# Patient Record
Sex: Male | Born: 1965 | Race: Black or African American | Hispanic: No | Marital: Single | Smoking: Current every day smoker
Health system: Southern US, Community
[De-identification: ages and names within clinical notes are randomized; demographics above are authoritative.]

## PROBLEM LIST (undated history)

## (undated) ENCOUNTER — Emergency Department (HOSPITAL_COMMUNITY): Admission: EM | Payer: Self-pay | Source: Home / Self Care

## (undated) ENCOUNTER — Ambulatory Visit (HOSPITAL_COMMUNITY): Discharge status: Absent without leave | Payer: Self-pay

## (undated) DIAGNOSIS — E119 Type 2 diabetes mellitus without complications: Secondary | ICD-10-CM

## (undated) DIAGNOSIS — K279 Peptic ulcer, site unspecified, unspecified as acute or chronic, without hemorrhage or perforation: Secondary | ICD-10-CM

## (undated) DIAGNOSIS — I10 Essential (primary) hypertension: Secondary | ICD-10-CM

---

## 1997-09-10 ENCOUNTER — Emergency Department (HOSPITAL_COMMUNITY): Admission: EM | Admit: 1997-09-10 | Discharge: 1997-09-10 | Payer: Self-pay | Admitting: Emergency Medicine

## 1998-04-19 ENCOUNTER — Emergency Department (HOSPITAL_COMMUNITY): Admission: EM | Admit: 1998-04-19 | Discharge: 1998-04-19 | Payer: Self-pay | Admitting: Emergency Medicine

## 1998-12-21 ENCOUNTER — Emergency Department (HOSPITAL_COMMUNITY): Admission: EM | Admit: 1998-12-21 | Discharge: 1998-12-21 | Payer: Self-pay | Admitting: Emergency Medicine

## 1999-06-12 ENCOUNTER — Emergency Department (HOSPITAL_COMMUNITY): Admission: EM | Admit: 1999-06-12 | Discharge: 1999-06-12 | Payer: Self-pay | Admitting: Emergency Medicine

## 1999-06-21 ENCOUNTER — Emergency Department (HOSPITAL_COMMUNITY): Admission: EM | Admit: 1999-06-21 | Discharge: 1999-06-21 | Payer: Self-pay | Admitting: Emergency Medicine

## 1999-11-22 ENCOUNTER — Encounter: Payer: Self-pay | Admitting: Emergency Medicine

## 1999-11-22 ENCOUNTER — Emergency Department (HOSPITAL_COMMUNITY): Admission: EM | Admit: 1999-11-22 | Discharge: 1999-11-22 | Payer: Self-pay | Admitting: Emergency Medicine

## 1999-12-19 ENCOUNTER — Encounter: Payer: Self-pay | Admitting: Emergency Medicine

## 1999-12-19 ENCOUNTER — Emergency Department (HOSPITAL_COMMUNITY): Admission: EM | Admit: 1999-12-19 | Discharge: 1999-12-19 | Payer: Self-pay | Admitting: *Deleted

## 2003-12-09 ENCOUNTER — Emergency Department (HOSPITAL_COMMUNITY): Admission: EM | Admit: 2003-12-09 | Discharge: 2003-12-09 | Payer: Self-pay | Admitting: Emergency Medicine

## 2005-05-30 ENCOUNTER — Emergency Department (HOSPITAL_COMMUNITY): Admission: EM | Admit: 2005-05-30 | Discharge: 2005-05-30 | Payer: Self-pay | Admitting: Emergency Medicine

## 2006-11-13 ENCOUNTER — Other Ambulatory Visit: Payer: Self-pay

## 2006-11-13 ENCOUNTER — Emergency Department: Payer: Self-pay | Admitting: Emergency Medicine

## 2008-12-30 ENCOUNTER — Encounter: Admission: RE | Admit: 2008-12-30 | Discharge: 2008-12-30 | Payer: Self-pay | Admitting: Occupational Medicine

## 2009-02-12 ENCOUNTER — Emergency Department (HOSPITAL_COMMUNITY): Admission: EM | Admit: 2009-02-12 | Discharge: 2009-02-12 | Payer: Self-pay | Admitting: Emergency Medicine

## 2013-07-05 ENCOUNTER — Encounter (HOSPITAL_COMMUNITY): Payer: Self-pay | Admitting: Emergency Medicine

## 2013-07-05 ENCOUNTER — Emergency Department (INDEPENDENT_AMBULATORY_CARE_PROVIDER_SITE_OTHER)
Admission: EM | Admit: 2013-07-05 | Discharge: 2013-07-05 | Disposition: A | Discharge status: Home / Self Care | Payer: No Typology Code available for payment source | Source: Home / Self Care | Attending: Emergency Medicine | Admitting: Emergency Medicine

## 2013-07-05 DIAGNOSIS — M5431 Sciatica, right side: Secondary | ICD-10-CM

## 2013-07-05 DIAGNOSIS — M5430 Sciatica, unspecified side: Secondary | ICD-10-CM

## 2013-07-05 HISTORY — DX: Type 2 diabetes mellitus without complications: E11.9

## 2013-07-05 MED ORDER — CYCLOBENZAPRINE HCL 10 MG PO TABS: 10.0000 mg | ORAL_TABLET | Freq: Three times a day (TID) | ORAL | Status: DC | PRN

## 2013-07-05 MED ORDER — PREDNISONE 20 MG PO TABS
ORAL_TABLET | ORAL | Status: AC
  Filled 2013-07-05: qty 3

## 2013-07-05 MED ORDER — HYDROCODONE-ACETAMINOPHEN 5-325 MG PO TABS
ORAL_TABLET | ORAL | Status: AC
  Filled 2013-07-05: qty 1

## 2013-07-05 MED ORDER — PREDNISONE 20 MG PO TABS: 40.0000 mg | ORAL_TABLET | Freq: Every day | ORAL | Status: DC

## 2013-07-05 MED ORDER — PREDNISONE 20 MG PO TABS
60.0000 mg | ORAL_TABLET | Freq: Once | ORAL | Status: AC
  Administered 2013-07-05: 60 mg via ORAL

## 2013-07-05 MED ORDER — HYDROCODONE-ACETAMINOPHEN 5-325 MG PO TABS: 1.0000 | ORAL_TABLET | ORAL | Status: DC | PRN

## 2013-07-05 MED ORDER — HYDROCODONE-ACETAMINOPHEN 5-325 MG PO TABS
1.0000 | ORAL_TABLET | Freq: Once | ORAL | Status: AC
  Administered 2013-07-05: 1 via ORAL

## 2013-07-05 NOTE — ED Provider Notes (Signed)
CSN: [id number]     Arrival date & time 07/05/13  1042 History   First MD Initiated Contact with Patient 07/05/13 1132     Chief Complaint  Patient presents with  . Back Pain   (Consider location/radiation/quality/duration/timing/severity/associated sxs/prior Treatment) Patient is a 48 y.o. male presenting with back pain. The history is provided by the patient.  Back Pain Location:  Lumbar spine Quality:  Burning Radiates to:  R thigh Pain severity:  Severe Onset quality:  Sudden Duration:  3 days Timing:  Constant Progression:  Worsening Chronicity:  New Context: lifting heavy objects   Relieved by:  Nothing Worsened by:  Ambulation, standing, sitting and movement Associated symptoms: leg pain   Associated symptoms: no bladder incontinence, no bowel incontinence, no dysuria, no fever, no paresthesias, no perianal numbness, no tingling and no weakness   Pt reports increasing (R) lower back pain since heavy lifting at work on Thursday. The pain radiates into his (R) anterior thigh and is worse with most movement especially bending, twisting and sitting. Has received no relief w/ OTC "muscle pain rubs". Denies weakness of RLE or other focal neurological symptoms.   Past Medical History  Diagnosis Date  . Diabetes mellitus without complication    History reviewed. No pertinent past surgical history. No family history on file. History  Substance Use Topics  . Smoking status: Never Smoker   . Smokeless tobacco: Not on file  . Alcohol Use: No    Review of Systems  Constitutional: Negative for fever.  HENT: Negative.   Eyes: Negative.   Respiratory: Negative.   Cardiovascular: Negative.   Gastrointestinal: Negative.  Negative for bowel incontinence.  Endocrine: Negative.   Genitourinary: Negative.  Negative for bladder incontinence and dysuria.  Musculoskeletal: Positive for back pain.  Skin: Negative.   Allergic/Immunologic: Negative.   Neurological: Negative.  Negative  for tingling, weakness and paresthesias.  Hematological: Negative.   Psychiatric/Behavioral: Negative.     Allergies  Review of patient's allergies indicates no known allergies.  Home Medications   Current Outpatient Rx  Name  Route  Sig  Dispense  Refill  . insulin NPH-regular Human (NOVOLIN 70/30) (70-30) 100 UNIT/ML injection   Subcutaneous   Inject into the skin.          BP 135/81  Pulse 75  Temp(Src) 98.6 F (37 C) (Oral)  Resp 14  SpO2 100% Physical Exam  Constitutional: He is oriented to person, place, and time. He appears well-developed and well-nourished.  HENT:  Head: Normocephalic and atraumatic.  Eyes: Conjunctivae are normal.  Cardiovascular: Normal rate.   Pulmonary/Chest: Effort normal.  Musculoskeletal:       Back:  TTP over (R) lumbar paraspinal region, no bony TTP over L-spine.   Neurological: He is alert and oriented to person, place, and time.  Skin: Skin is warm and dry.  Psychiatric: He has a normal mood and affect.    ED Course  Procedures (including critical care time) Labs Review Labs Reviewed - No data to display Imaging Review No results found.    MDM  No diagnosis found. (R) Sciatica s/p heavy lifting on Thursday w/ radiation of pain into RLE. Will treat w/ Prednisone, Flexeril and short course of medication for pain. Will provise ortho referral for f/u if pain persist after tx. Pt agreeable w/ plan.    Leanne ChangKatherine P Roseanne Juenger, NP 07/05/13 1241

## 2013-07-05 NOTE — ED Notes (Signed)
Pt  Reports   Pain  In  Back  Radiating  Down  r      Leg   With  Some  Numbness  As  Well    Pt  States  He    Did  Some  Lifting  2  Days  Ago        He  denys  Any  Urinary  Symptoms

## 2013-07-05 NOTE — Discharge Instructions (Signed)
Take medications as directed, rest for the next 2 days with no heavy lifting or strenuous activity. You may return to work Tuesday if you can be provided with "light duties'. No heavy lifting over 15-20 lbs. If your pain persist after treatment we have provided a referral to an orthopedic doctor with whom you can arrange follow up.   Sciatica Sciatica is pain, weakness, numbness, or tingling along your sciatic nerve. The nerve starts in the lower back and runs down the back of each leg. Nerve damage or certain conditions pinch or put pressure on the sciatic nerve. This causes the pain, weakness, and other discomforts of sciatica. HOME CARE   Only take medicine as told by your doctor.  Apply ice to the affected area for 20 minutes. Do this 3 4 times a day for the first 48 72 hours. Then try heat in the same way.  Exercise, stretch, or do your usual activities if these do not make your pain worse.  Go to physical therapy as told by your doctor.  Keep all doctor visits as told.  Do not wear high heels or shoes that are not supportive.  Get a firm mattress if your mattress is too soft to lessen pain and discomfort. GET HELP RIGHT AWAY IF:   You cannot control when you poop (bowel movement) or pee (urinate).  You have more weakness in your lower back, lower belly (pelvis), butt (buttocks), or legs.  You have redness or puffiness (swelling) of your back.  You have a burning feeling when you pee.  You have pain that gets worse when you lie down.  You have pain that wakes you from your sleep.  Your pain is worse than past pain.  Your pain lasts longer than 4 weeks.  You are suddenly losing weight without reason. MAKE SURE YOU:   Understand these instructions.  Will watch this condition.  Will get help right away if you are not doing well or get worse. Document Released: 02/29/2008 Document Revised: 11/21/2011 Document Reviewed: 10/01/2011 [facility] Patient Information 2014  [place], [place].

## 2013-07-06 NOTE — ED Provider Notes (Signed)
Medical screening examination/treatment/procedure(s) were performed by non-physician practitioner and as supervising physician I was immediately available for consultation/collaboration.  Leslee Homeavid Jodie Leiner, M.D.   Reuben Likesavid C Christinna Sprung, MD 07/06/13 [phone]

## 2013-07-16 ENCOUNTER — Encounter (HOSPITAL_COMMUNITY): Payer: Self-pay | Admitting: Emergency Medicine

## 2013-07-16 ENCOUNTER — Emergency Department (INDEPENDENT_AMBULATORY_CARE_PROVIDER_SITE_OTHER)
Admission: EM | Admit: 2013-07-16 | Discharge: 2013-07-16 | Disposition: A | Discharge status: Home / Self Care | Payer: No Typology Code available for payment source | Source: Home / Self Care | Attending: Family Medicine | Admitting: Family Medicine

## 2013-07-16 ENCOUNTER — Emergency Department (INDEPENDENT_AMBULATORY_CARE_PROVIDER_SITE_OTHER): Payer: No Typology Code available for payment source

## 2013-07-16 DIAGNOSIS — M545 Low back pain, unspecified: Secondary | ICD-10-CM

## 2013-07-16 MED ORDER — DICLOFENAC SODIUM 75 MG PO TBEC: 75.0000 mg | DELAYED_RELEASE_TABLET | Freq: Two times a day (BID) | ORAL | Status: DC

## 2013-07-16 NOTE — ED Notes (Signed)
Seen [facility] on 1-31, c/o out of medications and is continuing to have pain issues, could hardly sleep last night due to pain

## 2013-07-16 NOTE — Discharge Instructions (Signed)
Back Exercises °Back exercises help treat and prevent back injuries. The goal of back exercises is to increase the strength of your abdominal and back muscles and the flexibility of your back. These exercises should be started when you no longer have back pain. Back exercises include: °· Pelvic Tilt. Lie on your back with your knees bent. Tilt your pelvis until the lower part of your back is against the floor. Hold this position 5 to 10 sec and repeat 5 to 10 times. °· Knee to Chest. Pull first 1 knee up against your chest and hold for 20 to 30 seconds, repeat this with the other knee, and then both knees. This may be done with the other leg straight or bent, whichever feels better. °· Sit-Ups or Curl-Ups. Bend your knees 90 degrees. Start with tilting your pelvis, and do a partial, slow sit-up, lifting your trunk only 30 to 45 degrees off the floor. Take at least 2 to 3 seconds for each sit-up. Do not do sit-ups with your knees out straight. If partial sit-ups are difficult, simply do the above but with only tightening your abdominal muscles and holding it as directed. °· Hip-Lift. Lie on your back with your knees flexed 90 degrees. Push down with your feet and shoulders as you raise your hips a couple inches off the floor; hold for 10 seconds, repeat 5 to 10 times. °· Back arches. Lie on your stomach, propping yourself up on bent elbows. Slowly press on your hands, causing an arch in your low back. Repeat 3 to 5 times. Any initial stiffness and discomfort should lessen with repetition over time. °· Shoulder-Lifts. Lie face down with arms beside your body. Keep hips and torso pressed to floor as you slowly lift your head and shoulders off the floor. °Do not overdo your exercises, especially in the beginning. Exercises may cause you some mild back discomfort which lasts for a few minutes; however, if the pain is more severe, or lasts for more than 15 minutes, do not continue exercises until you see your caregiver.  Improvement with exercise therapy for back problems is slow.  °See your caregivers for assistance with developing a proper back exercise program. °Document Released: 06/29/2004 Document Revised: 08/14/2011 Document Reviewed: 03/23/2011 °ExitCare® Patient Information ©2014 [employer]. ° °Back Injury Prevention °Back injuries can be extremely painful and difficult to heal. After having one back injury, you are much more likely to experience another later on. It is important to learn how to avoid injuring or re-injuring your back. The following tips can help you to prevent a back injury. °PHYSICAL FITNESS °· Exercise regularly and try to develop good tone in your abdominal muscles. Your abdominal muscles provide a lot of the support needed by your back. °· Do aerobic exercises (walking, jogging, biking, swimming) regularly. °· Do exercises that increase balance and strength (tai chi, yoga) regularly. This can decrease your risk of falling and injuring your back. °· Stretch before and after exercising. °· Maintain a healthy weight. The more you weigh, the more stress is placed on your back. For every pound of weight, 10 times that amount of pressure is placed on the back. °DIET °· Talk to your caregiver about how much calcium and vitamin D you need per day. These nutrients help to prevent weakening of the bones (osteoporosis). Osteoporosis can cause broken (fractured) bones that lead to back pain. °· Include good sources of calcium in your diet, such as dairy products, green, leafy vegetables, and products with   calcium added (fortified).  Include good sources of vitamin D in your diet, such as milk and foods that are fortified with vitamin D.  Consider taking a nutritional supplement or a multivitamin if needed.  Stop smoking if you smoke. POSTURE  Sit and stand up straight. Avoid leaning forward when you sit or hunching over when you stand.  Choose chairs with good low back (lumbar) support.  If you  work at a desk, sit close to your work so you do not need to lean over. Keep your chin tucked in. Keep your neck drawn back and elbows bent at a right angle. Your arms should look like the letter "L."  Sit high and close to the steering wheel when you drive. Add a lumbar support to your car seat if needed.  Avoid sitting or standing in one position for too long. Take breaks to get up, stretch, and walk around at least once every hour. Take breaks if you are driving for long periods of time.  Sleep on your side with your knees slightly bent, or sleep on your back with a pillow under your knees. Do not sleep on your stomach. LIFTING, TWISTING, AND REACHING  Avoid heavy lifting, especially repetitive lifting. If you must do heavy lifting:  Stretch before lifting.  Work slowly.  Rest between lifts.  Use carts and dollies to move objects when possible.  Make several small trips instead of carrying 1 heavy load.  Ask for help when you need it.  Ask for help when moving big, awkward objects.  Follow these steps when lifting:  Stand with your feet shoulder-width apart.  Get as close to the object as you can. Do not try to pick up heavy objects that are far from your body.  Use handles or lifting straps if they are available.  Bend at your knees. Squat down, but keep your heels off the floor.  Keep your shoulders pulled back, your chin tucked in, and your back straight.  Lift the object slowly, tightening the muscles in your legs, abdomen, and buttocks. Keep the object as close to the center of your body as possible.  When you put a load down, use these same guidelines in reverse.  Do not:  Lift the object above your waist.  Twist at the waist while lifting or carrying a load. Move your feet if you need to turn, not your waist.  Bend over without bending at your knees.  Avoid reaching over your head, across a table, or for an object on a high surface. OTHER TIPS  Avoid wet  floors and keep sidewalks clear of ice to prevent falls.  Do not sleep on a mattress that is too soft or too hard.  Keep items that are used frequently within easy reach.  Put heavier objects on shelves at waist level and lighter objects on lower or higher shelves.  Find ways to decrease your stress, such as exercise, massage, or relaxation techniques. Stress can build up in your muscles. Tense muscles are more vulnerable to injury.  Seek treatment for depression or anxiety if needed. These conditions can increase your risk of developing back pain. SEEK MEDICAL CARE IF:  You injure your back.  You have questions about diet, exercise, or other ways to prevent back injuries. MAKE SURE YOU:  Understand these instructions.  Will watch your condition.  Will get help right away if you are not doing well or get worse. Document Released: 06/29/2004 Document Revised: 08/14/2011 Document Reviewed:  07/03/2011 ExitCare Patient Information 2014 [place]. Your xrays were normal. I would encourage you to use medications as prescribed here and use the resource information to locate primary care provider for follow up if your symptoms persist and for management of your diabetes.
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
## 2013-07-17 NOTE — ED Provider Notes (Signed)
Medical screening examination/treatment/procedure(s) were performed by a resident physician or non-physician practitioner and as the supervising physician I was immediately available for consultation/collaboration.  Forever Arechiga, MD    Mauro Arps S Zareen Jamison, MD 07/17/13 1458 

## 2013-08-14 ENCOUNTER — Encounter (HOSPITAL_COMMUNITY): Payer: Self-pay | Admitting: Emergency Medicine

## 2013-08-14 ENCOUNTER — Emergency Department (INDEPENDENT_AMBULATORY_CARE_PROVIDER_SITE_OTHER)
Admission: EM | Admit: 2013-08-14 | Discharge: 2013-08-14 | Disposition: A | Discharge status: Home / Self Care | Payer: No Typology Code available for payment source | Source: Home / Self Care | Attending: Family Medicine | Admitting: Family Medicine

## 2013-08-14 DIAGNOSIS — M5430 Sciatica, unspecified side: Secondary | ICD-10-CM

## 2013-08-14 DIAGNOSIS — M5431 Sciatica, right side: Secondary | ICD-10-CM

## 2013-08-14 MED ORDER — DICLOFENAC SODIUM 75 MG PO TBEC: 75.0000 mg | DELAYED_RELEASE_TABLET | Freq: Two times a day (BID) | ORAL | Status: DC | PRN

## 2013-08-14 NOTE — Discharge Instructions (Signed)
Back Exercises Back exercises help treat and prevent back injuries. The goal of back exercises is to increase the strength of your abdominal and back muscles and the flexibility of your back. These exercises should be started when you no longer have back pain. Back exercises include:  Pelvic Tilt. Lie on your back with your knees bent. Tilt your pelvis until the lower part of your back is against the floor. Hold this position 5 to 10 sec and repeat 5 to 10 times.  Knee to Chest. Pull first 1 knee up against your chest and hold for 20 to 30 seconds, repeat this with the other knee, and then both knees. This may be done with the other leg straight or bent, whichever feels better.  Sit-Ups or Curl-Ups. Bend your knees 90 degrees. Start with tilting your pelvis, and do a partial, slow sit-up, lifting your trunk only 30 to 45 degrees off the floor. Take at least 2 to 3 seconds for each sit-up. Do not do sit-ups with your knees out straight. If partial sit-ups are difficult, simply do the above but with only tightening your abdominal muscles and holding it as directed.  Hip-Lift. Lie on your back with your knees flexed 90 degrees. Push down with your feet and shoulders as you raise your hips a couple inches off the floor; hold for 10 seconds, repeat 5 to 10 times.  Back arches. Lie on your stomach, propping yourself up on bent elbows. Slowly press on your hands, causing an arch in your low back. Repeat 3 to 5 times. Any initial stiffness and discomfort should lessen with repetition over time.  Shoulder-Lifts. Lie face down with arms beside your body. Keep hips and torso pressed to floor as you slowly lift your head and shoulders off the floor. Do not overdo your exercises, especially in the beginning. Exercises may cause you some mild back discomfort which lasts for a few minutes; however, if the pain is more severe, or lasts for more than 15 minutes, do not continue exercises until you see your caregiver.  Improvement with exercise therapy for back problems is slow.  See your caregivers for assistance with developing a proper back exercise program. Document Released: 06/29/2004 Document Revised: 08/14/2011 Document Reviewed: 03/23/2011 ExitCare Patient Information 2014 [employer].  

## 2013-08-14 NOTE — ED Notes (Signed)
C/o hip pain States he wants a refill on medication; muscle relaxer and pain reliever

## 2013-08-14 NOTE — ED Provider Notes (Signed)
CSN: [id number]     Arrival date & time 08/14/13  1333 History   First MD Initiated Contact with Patient 08/14/13 1414     Chief Complaint  Patient presents with  . Hip Pain   (Consider location/radiation/quality/duration/timing/severity/associated sxs/prior Treatment) HPI Comments: Hx of chronic back pain (lumbago) and right sided sciatica. [facility] LS-Spine films in Feb. 2015 read as normal. Requesting additional vicodin and flexeril.  Was provided resources to establish PCP in Feb. 2015 and patient reports he did not follow through with this recommendation because he misplaced the paperwork.  Hx of polysubstance abuse and recent incarceration.  Denies any new or changing symptoms Denies fever, bowel or bladder incontinence. Denies numbness in genital region. Denies any loss of strength, sensation or coordination of lower extremities.   The history is provided by the patient.    Past Medical History  Diagnosis Date  . Diabetes mellitus without complication    History reviewed. No pertinent past surgical history. History reviewed. No pertinent family history. History  Substance Use Topics  . Smoking status: Never Smoker   . Smokeless tobacco: Not on file  . Alcohol Use: No    Review of Systems  All other systems reviewed and are negative.    Allergies  Review of patient's allergies indicates no known allergies.  Home Medications   Current Outpatient Rx  Name  Route  Sig  Dispense  Refill  . cyclobenzaprine (FLEXERIL) 10 MG tablet   Oral   Take 1 tablet (10 mg total) by mouth 3 (three) times daily as needed for muscle spasms (and or back pain).   20 tablet   0   . diclofenac (VOLTAREN) 75 MG EC tablet   Oral   Take 1 tablet (75 mg total) by mouth 2 (two) times daily.   30 tablet   1   . diclofenac (VOLTAREN) 75 MG EC tablet   Oral   Take 1 tablet (75 mg total) by mouth 2 (two) times daily as needed.   20 tablet   0   .  HYDROcodone-acetaminophen (NORCO/VICODIN) 5-325 MG per tablet   Oral   Take 1 tablet by mouth every 4 (four) hours as needed for moderate pain or severe pain.   10 tablet   0   . insulin NPH-regular Human (NOVOLIN 70/30) (70-30) 100 UNIT/ML injection   Subcutaneous   Inject into the skin.         . predniSONE (DELTASONE) 20 MG tablet   Oral   Take 2 tablets (40 mg total) by mouth daily with breakfast. For 5 days   10 tablet   0    BP 133/95  Pulse 92  Temp(Src) 98.1 F (36.7 C) (Oral)  Resp 18  SpO2 100% Physical Exam  Nursing note and vitals reviewed. Constitutional: He is oriented to person, place, and time. He appears well-developed and well-nourished. No distress.  HENT:  Head: Normocephalic and atraumatic.  Eyes: Conjunctivae are normal. No scleral icterus.  Neck: Normal range of motion. Neck supple.  Cardiovascular: Normal rate, regular rhythm and normal heart sounds.   Pulmonary/Chest: Effort normal and breath sounds normal.  Abdominal: Soft. Bowel sounds are normal. He exhibits no distension. There is no tenderness.  Musculoskeletal: Normal range of motion.       Arms: Outlined area is the area of reported discomfort. +point tenderness at right SI joint. CSM exam of RLE normal/intact. +SLR on right  Neurological: He is  alert and oriented to person, place, and time.  Skin: Skin is warm and dry. No rash noted.  Psychiatric: He has a normal mood and affect. His behavior is normal.    ED Course  Procedures (including critical care time) Labs Review Labs Reviewed - No data to display Imaging Review No results found.   MDM   1. Sciatica of right side    Chronic lower back pain/sciatica: Again, explained to patient the importance of establishing with a PCP for long term management and necessary referrals to pain management, physical therapy or orthopedic specialist.    Ardis Rowan, PA 08/14/13 1526

## 2013-08-15 NOTE — ED Provider Notes (Signed)
Medical screening examination/treatment/procedure(s) were performed by a resident physician or non-physician practitioner and as the supervising physician I was immediately available for consultation/collaboration.  Adir Schicker, MD    Tesha Archambeau S Sawyer Mentzer, MD 08/15/13 0737 

## 2013-08-23 ENCOUNTER — Encounter: Payer: Self-pay | Admitting: Internal Medicine

## 2013-08-23 ENCOUNTER — Ambulatory Visit: Payer: No Typology Code available for payment source | Attending: Internal Medicine | Admitting: Internal Medicine

## 2013-08-23 ENCOUNTER — Encounter (HOSPITAL_COMMUNITY): Payer: Self-pay | Admitting: Emergency Medicine

## 2013-08-23 ENCOUNTER — Emergency Department (HOSPITAL_COMMUNITY)
Admission: EM | Admit: 2013-08-23 | Discharge: 2013-08-23 | Disposition: A | Discharge status: Home / Self Care | Payer: No Typology Code available for payment source | Attending: Emergency Medicine | Admitting: Emergency Medicine

## 2013-08-23 VITALS — BP 149/99 | HR 100 | Temp 97.7°F | Ht 72.0 in | Wt 161.2 lb

## 2013-08-23 DIAGNOSIS — F17200 Nicotine dependence, unspecified, uncomplicated: Secondary | ICD-10-CM | POA: Insufficient documentation

## 2013-08-23 DIAGNOSIS — E119 Type 2 diabetes mellitus without complications: Secondary | ICD-10-CM | POA: Insufficient documentation

## 2013-08-23 DIAGNOSIS — Z91199 Patient's noncompliance with other medical treatment and regimen due to unspecified reason: Secondary | ICD-10-CM | POA: Insufficient documentation

## 2013-08-23 DIAGNOSIS — Z79899 Other long term (current) drug therapy: Secondary | ICD-10-CM | POA: Insufficient documentation

## 2013-08-23 DIAGNOSIS — Z794 Long term (current) use of insulin: Secondary | ICD-10-CM | POA: Insufficient documentation

## 2013-08-23 DIAGNOSIS — M549 Dorsalgia, unspecified: Secondary | ICD-10-CM | POA: Insufficient documentation

## 2013-08-23 DIAGNOSIS — Z9119 Patient's noncompliance with other medical treatment and regimen: Secondary | ICD-10-CM | POA: Insufficient documentation

## 2013-08-23 DIAGNOSIS — R739 Hyperglycemia, unspecified: Secondary | ICD-10-CM

## 2013-08-23 DIAGNOSIS — Z008 Encounter for other general examination: Secondary | ICD-10-CM | POA: Insufficient documentation

## 2013-08-23 LAB — COMPREHENSIVE METABOLIC PANEL
ALT: 31 U/L (ref 0–53)
AST: 31 U/L (ref 0–37)
Albumin: 4.4 g/dL (ref 3.5–5.2)
Alkaline Phosphatase: 78 U/L (ref 39–117)
BUN: 12 mg/dL (ref 6–23)
CALCIUM: 10.9 mg/dL — AB (ref 8.4–10.5)
CO2: 22 mEq/L (ref 19–32)
Chloride: 98 mEq/L (ref 96–112)
Creatinine, Ser: 0.78 mg/dL (ref 0.50–1.35)
GFR calc non Af Amer: >90 mL/min (ref >90)
GLUCOSE: 101 mg/dL — AB (ref 70–99)
POTASSIUM: 4.2 meq/L (ref 3.7–5.3)
Sodium: 139 mEq/L (ref 137–147)
TOTAL PROTEIN: 8.6 g/dL — AB (ref 6.0–8.3)
Total Bilirubin: 0.3 mg/dL (ref 0.3–1.2)

## 2013-08-23 LAB — CBC
HCT: 44.1 % (ref 39.0–52.0)
Hemoglobin: 16.1 g/dL (ref 13.0–17.0)
MCH: 31.6 pg (ref 26.0–34.0)
MCHC: 36.5 g/dL — AB (ref 30.0–36.0)
MCV: 86.6 fL (ref 78.0–100.0)
PLATELETS: 338 10*3/uL (ref 150–400)
RBC: 5.09 MIL/uL (ref 4.22–5.81)
RDW: 12.4 % (ref 11.5–15.5)
WBC: 10.8 10*3/uL — ABNORMAL HIGH (ref 4.0–10.5)

## 2013-08-23 LAB — URINALYSIS, ROUTINE W REFLEX MICROSCOPIC
BILIRUBIN URINE: NEGATIVE
Glucose, UA: >1000 mg/dL — AB
Hgb urine dipstick: NEGATIVE
Ketones, ur: NEGATIVE mg/dL
LEUKOCYTES UA: NEGATIVE
NITRITE: NEGATIVE
PH: 5 (ref 5.0–8.0)
Protein, ur: NEGATIVE mg/dL
SPECIFIC GRAVITY, URINE: 1.031 — AB (ref 1.005–1.030)
Urobilinogen, UA: 0.2 mg/dL (ref 0.0–1.0)

## 2013-08-23 LAB — I-STAT CHEM 8, ED
BUN: 12 mg/dL (ref 6–23)
Calcium, Ion: 1.25 mmol/L — ABNORMAL HIGH (ref 1.12–1.23)
Chloride: 103 mEq/L (ref 96–112)
Creatinine, Ser: 0.9 mg/dL (ref 0.50–1.35)
Glucose, Bld: 122 mg/dL — ABNORMAL HIGH (ref 70–99)
HCT: 41 % (ref 39.0–52.0)
HEMOGLOBIN: 13.9 g/dL (ref 13.0–17.0)
POTASSIUM: 3.5 meq/L — AB (ref 3.7–5.3)
SODIUM: 142 meq/L (ref 137–147)
TCO2: 25 mmol/L (ref 0–100)

## 2013-08-23 LAB — CBG MONITORING, ED
GLUCOSE-CAPILLARY: 275 mg/dL — AB (ref 70–99)
GLUCOSE-CAPILLARY: 108 mg/dL — AB (ref 70–99)
GLUCOSE-CAPILLARY: 124 mg/dL — AB (ref 70–99)

## 2013-08-23 LAB — POCT GLYCOSYLATED HEMOGLOBIN (HGB A1C): HEMOGLOBIN A1C: 13.5

## 2013-08-23 LAB — GLUCOSE, POCT (MANUAL RESULT ENTRY): POC Glucose: 591 mg/dl — AB (ref 70–99)

## 2013-08-23 LAB — URINE MICROSCOPIC-ADD ON: Urine-Other: NONE SEEN

## 2013-08-23 MED ORDER — ACETAMINOPHEN-CODEINE #3 300-30 MG PO TABS: 1.0000 | ORAL_TABLET | Freq: Three times a day (TID) | ORAL | Status: DC | PRN

## 2013-08-23 MED ORDER — INSULIN ASPART 100 UNIT/ML ~~LOC~~ SOLN
20.0000 [IU] | Freq: Once | SUBCUTANEOUS | Status: DC
  Administered 2013-08-23: 20 [IU] via SUBCUTANEOUS

## 2013-08-23 MED ORDER — GABAPENTIN 100 MG PO CAPS: 100.0000 mg | ORAL_CAPSULE | Freq: Three times a day (TID) | ORAL | Status: DC

## 2013-08-23 MED ORDER — FREESTYLE SYSTEM KIT: 1.0000 | PACK | Status: DC | PRN

## 2013-08-23 MED ORDER — INSULIN NPH ISOPHANE & REGULAR (70-30) 100 UNIT/ML ~~LOC~~ SUSP: SUBCUTANEOUS | Status: DC

## 2013-08-23 MED ORDER — CYCLOBENZAPRINE HCL 10 MG PO TABS: 10.0000 mg | ORAL_TABLET | Freq: Three times a day (TID) | ORAL | Status: DC | PRN

## 2013-08-23 MED ORDER — SODIUM CHLORIDE 0.9 % IV BOLUS (SEPSIS)
1000.0000 mL | Freq: Once | INTRAVENOUS | Status: AC
Start: 2013-08-23 — End: 2013-08-23
  Administered 2013-08-23: 1000 mL via INTRAVENOUS

## 2013-08-23 MED ORDER — SODIUM CHLORIDE 0.9 % IV BOLUS (SEPSIS)
1000.0000 mL | Freq: Once | INTRAVENOUS | Status: AC
  Administered 2013-08-23: 1000 mL via INTRAVENOUS

## 2013-08-23 MED ORDER — GLUCOSE BLOOD VI STRP: ORAL_STRIP | Status: DC

## 2013-08-23 NOTE — Discharge Instructions (Signed)
Hyperglycemia °Hyperglycemia occurs when the glucose (sugar) in your blood is too high. Hyperglycemia can happen for many reasons, but it most often happens to people who do not know they have diabetes or are not managing their diabetes properly.  °CAUSES  °Whether you have diabetes or not, there are other causes of hyperglycemia. Hyperglycemia can occur when you have diabetes, but it can also occur in other situations that you might not be as aware of, such as: °Diabetes °· If you have diabetes and are having problems controlling your blood glucose, hyperglycemia could occur because of some of the following reasons: °· Not following your meal plan. °· Not taking your diabetes medications or not taking it properly. °· Exercising less or doing less activity than you normally do. °· Being sick. °Pre-diabetes °· This cannot be ignored. Before people develop Type 2 diabetes, they almost always have "pre-diabetes." This is when your blood glucose levels are higher than normal, but not yet high enough to be diagnosed as diabetes. Research has shown that some long-term damage to the body, especially the heart and circulatory system, may already be occurring during pre-diabetes. If you take action to manage your blood glucose when you have pre-diabetes, you may delay or prevent Type 2 diabetes from developing. °Stress °· If you have diabetes, you may be "diet" controlled or on oral medications or insulin to control your diabetes. However, you may find that your blood glucose is higher than usual in the hospital whether you have diabetes or not. This is often referred to as "stress hyperglycemia." Stress can elevate your blood glucose. This happens because of hormones put out by the body during times of stress. If stress has been the cause of your high blood glucose, it can be followed regularly by your caregiver. That way he/she can make sure your hyperglycemia does not continue to get worse or progress to  diabetes. °Steroids °· Steroids are medications that act on the infection fighting system (immune system) to block inflammation or infection. One side effect can be a rise in blood glucose. Most people can produce enough extra insulin to allow for this rise, but for those who cannot, steroids make blood glucose levels go even higher. It is not unusual for steroid treatments to "uncover" diabetes that is developing. It is not always possible to determine if the hyperglycemia will go away after the steroids are stopped. A special blood test called an A1c is sometimes done to determine if your blood glucose was elevated before the steroids were started. °SYMPTOMS °· Thirsty. °· Frequent urination. °· Dry mouth. °· Blurred vision. °· Tired or fatigue. °· Weakness. °· Sleepy. °· Tingling in feet or leg. °DIAGNOSIS  °Diagnosis is made by monitoring blood glucose in one or all of the following ways: °· A1c test. This is a chemical found in your blood. °· Fingerstick blood glucose monitoring. °· Laboratory results. °TREATMENT  °First, knowing the cause of the hyperglycemia is important before the hyperglycemia can be treated. Treatment may include, but is not be limited to: °· Education. °· Change or adjustment in medications. °· Change or adjustment in meal plan. °· Treatment for an illness, infection, etc. °· More frequent blood glucose monitoring. °· Change in exercise plan. °· Decreasing or stopping steroids. °· Lifestyle changes. °HOME CARE INSTRUCTIONS  °· Test your blood glucose as directed. °· Exercise regularly. Your caregiver will give you instructions about exercise. Pre-diabetes or diabetes which comes on with stress is helped by exercising. °· Eat wholesome,   balanced meals. Eat often and at regular, fixed times. Your caregiver or nutritionist will give you a meal plan to guide your sugar intake.  Being at an ideal weight is important. If needed, losing as little as 10 to 15 pounds may help improve blood  glucose levels. SEEK MEDICAL CARE IF:   You have questions about medicine, activity, or diet.  You continue to have symptoms (problems such as increased thirst, urination, or weight gain). SEEK IMMEDIATE MEDICAL CARE IF:   You are vomiting or have diarrhea.  Your breath smells fruity.  You are breathing faster or slower.  You are very sleepy or incoherent.  You have numbness, tingling, or pain in your feet or hands.  You have chest pain.  Your symptoms get worse even though you have been following your caregiver's orders.  If you have any other questions or concerns. Document Released: 11/15/2000 Document Revised: 08/14/2011 Document Reviewed: 09/18/2011 [facility] Patient Information 2014 [place], [place].   Take insulin as directed  Follow nutritional guidelines from your doctor Return if symptoms worsen

## 2013-08-23 NOTE — ED Provider Notes (Signed)
CSN: [id number]     Arrival date & time 08/23/13  1218 History   First MD Initiated Contact with Patient 08/23/13 1401     Chief Complaint  Patient presents with  . Hyperglycemia     (Consider location/radiation/quality/duration/timing/severity/associated sxs/prior Treatment) HPI  48y.o pt with hx of insulin dependent diabetes sent over from health center with a CBG of 591.  Pt says he did not take his insulin today because he was drinking alcohol last night.  Denies N/V, SOB, confusion, polyuria, or polydypsia.  Current CBG is 108.  Denies recent illness, fever, diarrhea, HA.  Past Medical History  Diagnosis Date  . Diabetes mellitus without complication    History reviewed. No pertinent past surgical history. History reviewed. No pertinent family history. History  Substance Use Topics  . Smoking status: Current Every Day Smoker    Types: Cigarettes  . Smokeless tobacco: Not on file     Comment: 2  a day  . Alcohol Use: Yes    Review of Systems  Constitutional: Negative for fever, chills and diaphoresis.  HENT: Negative for congestion, rhinorrhea and sore throat.   Respiratory: Negative for cough and shortness of breath.   Cardiovascular: Negative for chest pain.  Gastrointestinal: Negative for nausea, vomiting, abdominal pain, diarrhea and abdominal distention.      Allergies  Review of patient's allergies indicates no known allergies.  Home Medications   Current Outpatient Rx  Name  Route  Sig  Dispense  Refill  . acetaminophen (TYLENOL) 325 MG tablet   Oral   Take 650 mg by mouth every 6 (six) hours as needed for mild pain, fever or headache.         . glucose blood test strip      Use as instructed   100 each   12   . glucose monitoring kit (FREESTYLE) monitoring kit   Does not apply   1 each by Does not apply route as needed for other.   1 each   2   . ibuprofen (ADVIL,MOTRIN) 200 MG tablet   Oral   Take 400 mg by mouth every 6 (six) hours as  needed for fever, headache or mild pain.         Marland Kitchen insulin NPH-regular Human (NOVOLIN 70/30) (70-30) 100 UNIT/ML injection      25 units in the morning and 25 units in the evening   10 mL   12   . acetaminophen-codeine (TYLENOL #3) 300-30 MG per tablet   Oral   Take 1 tablet by mouth every 8 (eight) hours as needed for severe pain.   60 tablet   0   . cyclobenzaprine (FLEXERIL) 10 MG tablet   Oral   Take 1 tablet (10 mg total) by mouth 3 (three) times daily as needed for muscle spasms (and or back pain).   30 tablet   2   . gabapentin (NEURONTIN) 100 MG capsule   Oral   Take 1 capsule (100 mg total) by mouth 3 (three) times daily.   120 capsule   4     100 mg s in the morning, 100 mg in the evening, 20 ...    BP 110/80  Pulse 94  Temp(Src) 98.7 F (37.1 C) (Oral)  Resp 18  SpO2 100% Physical Exam  Constitutional: He appears well-developed and well-nourished. No distress.  HENT:  Head: Normocephalic.  Eyes: Conjunctivae and EOM are normal. Pupils are equal, round, and reactive to light.  Cardiovascular: Normal rate,  regular rhythm and normal heart sounds.   Pulmonary/Chest: Effort normal and breath sounds normal. No respiratory distress. He has no wheezes.  Abdominal: Soft. Bowel sounds are normal. He exhibits no distension. There is no tenderness.    ED Course  Procedures (including critical care time) Labs Review Labs Reviewed  CBC - Abnormal; Notable for the following:    WBC 10.8 (*)    MCHC 36.5 (*)    All other components within normal limits  COMPREHENSIVE METABOLIC PANEL - Abnormal; Notable for the following:    Glucose, Bld 101 (*)    Calcium 10.9 (*)    Total Protein 8.6 (*)    All other components within normal limits  CBG MONITORING, ED - Abnormal; Notable for the following:    Glucose-Capillary 275 (*)    All other components within normal limits  CBG MONITORING, ED - Abnormal; Notable for the following:    Glucose-Capillary 108 (*)    All  other components within normal limits  URINALYSIS, ROUTINE W REFLEX MICROSCOPIC   Imaging Review No results found.   EKG Interpretation None      MDM   Final diagnoses:  Hyperglycemia    Pt is a non-adherent diabetic who drank a lot of alcohol last night and didn't take his insulin. Seen at health center earlier with CBG of 591, he was given novolog 20 units at that time and then sent to ER. NS boluses x 2 and AG decreased from 19 to 14. Pt reports feeling better. Will follow-up with his PCP. Discussed plan to take meds as directed and work within his nutritional guidelines. Return precautions given.       Elisha Headland, NP 08/28/13 2206

## 2013-08-23 NOTE — Progress Notes (Signed)
Establish Care, right lower back and hip pain.

## 2013-08-23 NOTE — Progress Notes (Signed)
Patient ID: John Howell, male   DOB: 01-10-1966, 48 y.o.   MRN: [id number]   CC:  HPI: 48 year old male with a history of diabetes since 2010 here to establish care. He also has history of back pain. He was in the ER on 3/12 and received 10 tablets of Vicodin and Flexeril for back pain. Plain x-rays on 2/11 showed no acute lumbar spinal abnormality but mild sclerosis of the left SI joint. Patient is complaining of numbness in his right lower extremity. No stool or urinary incontinence  Patient's A1c of 13.5, he is noncompliant with Accu-Cheks. He has not checked his sugar in a month. CBC today is 591. Denies any chest pain shortness of breath blurry vision  Social history smokes 2-3 cigarettes a day, drinks 6 beers on a daily basis.  Family history mother had hypertension    No Known Allergies Past Medical History  Diagnosis Date  . Diabetes mellitus without complication    Current Outpatient Prescriptions on File Prior to Visit  Medication Sig Dispense Refill  . diclofenac (VOLTAREN) 75 MG EC tablet Take 1 tablet (75 mg total) by mouth 2 (two) times daily.  30 tablet  1  . diclofenac (VOLTAREN) 75 MG EC tablet Take 1 tablet (75 mg total) by mouth 2 (two) times daily as needed.  20 tablet  0   No current facility-administered medications on file prior to visit.   No family history on file. History   Social History  . Marital Status: Married    Spouse Name: N/A    Number of Children: N/A  . Years of Education: N/A   Occupational History  . Not on file.   Social History Main Topics  . Smoking status: Current Every Day Smoker    Types: Cigarettes  . Smokeless tobacco: Not on file     Comment: 2  a day  . Alcohol Use: Yes  . Drug Use: No  . Sexual Activity: Not on file   Other Topics Concern  . Not on file   Social History Narrative  . No narrative on file    Review of Systems  Constitutional: Negative for fever, chills, diaphoresis, activity change, appetite  change and fatigue.  HENT: Negative for ear pain, nosebleeds, congestion, facial swelling, rhinorrhea, neck pain, neck stiffness and ear discharge.   Eyes: Negative for pain, discharge, redness, itching and visual disturbance.  Respiratory: Negative for cough, choking, chest tightness, shortness of breath, wheezing and stridor.   Cardiovascular: Negative for chest pain, palpitations and leg swelling.  Gastrointestinal: Negative for abdominal distention.  Genitourinary: Negative for dysuria, urgency, frequency, hematuria, flank pain, decreased urine volume, difficulty urinating and dyspareunia.  Musculoskeletal: Negative for back pain, joint swelling, arthralgias and gait problem.  Neurological: Negative for dizziness, tremors, seizures, syncope, facial asymmetry, speech difficulty, weakness, light-headedness, numbness and headaches.  Hematological: Negative for adenopathy. Does not bruise/bleed easily.  Psychiatric/Behavioral: Negative for hallucinations, behavioral problems, confusion, dysphoric mood, decreased concentration and agitation.    Objective:   Filed Vitals:   08/23/13 1100  BP: 149/99  Pulse: 100  Temp: 97.7 F (36.5 C)    Physical Exam  Constitutional: Appears well-developed and well-nourished. No distress.  HENT: Normocephalic. External right and left ear normal. Oropharynx is clear and moist.  Eyes: Conjunctivae and EOM are normal. PERRLA, no scleral icterus.  Neck: Normal ROM. Neck supple. No JVD. No tracheal deviation. No thyromegaly.  CVS: RRR, S1/S2 +, no murmurs, no gallops, no carotid bruit.  Pulmonary: Effort and  breath sounds normal, no stridor, rhonchi, wheezes, rales.  Abdominal: Soft. BS +,  no distension, tenderness, rebound or guarding.  Musculoskeletal: +point tenderness at right SI joint. CSM exam of RLE normal/intact. +SLR on right  Lymphadenopathy: No lymphadenopathy noted, cervical, inguinal. Neuro: Alert. Normal reflexes, muscle tone coordination.  No cranial nerve deficit. Skin: Skin is warm and dry. No rash noted. Not diaphoretic. No erythema. No pallor.  Psychiatric: Normal mood and affect. Behavior, judgment, thought content normal.   No results found for this basename: WBC, HGB, HCT, MCV, PLT   No results found for this basename: CREATININE, BUN, NA, K, CL, CO2    Lab Results  Component Value Date   HGBA1C 13.5 08/23/2013   Lipid Panel  No results found for this basename: chol, trig, hdl, cholhdl, vldl, ldlcalc       Assessment and plan:   There are no active problems to display for this patient.      Diabetes, insulin-dependent A1c 13.5 CPG 591 The patient was administered 20 units of NovoLog and directed toward the ED Patient is agreeable and therefore no further blood work is drawn today Concern for DKA, hyperosmolar hyperglycemic state Patient is only taking 8 units in the morning and 4 units in the evening I have increased his NPH 70/30 25 units twice a day Follow up with clinical pharmacy in one month Prescription for Glucometer and glucometer strips provided   Back pain Patient could have lumbar radiculopathy with numbness in his right leg The patient is also a heavy drinker, counseled about peripheral neuropathy and avascular necrosis related to alcohol Started him on Flexeril, gabapentin, Tylenol with Codeine If pain continues, may need a referral to a pain clinic Remote history of cocaine use in 2010 Please check UDS before pain clinic referral and followup   Establish care Obtain baseline labs Patient to followup with us in 2 months for diabetes      The patient was given clear instructions to go to ER or return to medical center if symptoms don't improve, worsen or new problems develop. The patient verbalized understanding. The patient was told to call to get any lab results if not heard anything in the next week.

## 2013-08-23 NOTE — ED Notes (Signed)
Pt presents with hyperglycemia. Pt states that he was seen at the health center and diagnosis with hyperglycemia  this morning with a cbg of 591 Pt states that he was given 20 units of novolog at that time abd sent to this facility for further evaluation.  Pt reports diaphoresis. Denies any dizziness, polyphagia or polyuria

## 2013-08-23 NOTE — ED Notes (Signed)
CBG 275 

## 2013-09-02 ENCOUNTER — Telehealth: Payer: Self-pay

## 2013-09-02 ENCOUNTER — Ambulatory Visit (INDEPENDENT_AMBULATORY_CARE_PROVIDER_SITE_OTHER): Payer: No Typology Code available for payment source | Admitting: Home Health Services

## 2013-09-02 DIAGNOSIS — E119 Type 2 diabetes mellitus without complications: Secondary | ICD-10-CM

## 2013-09-02 NOTE — ED Provider Notes (Signed)
Medical screening examination/treatment/procedure(s) were performed by non-physician practitioner and as supervising physician I was immediately available for consultation/collaboration.   EKG Interpretation None        John Howell M Rikki Trosper, DO 09/02/13 1039 

## 2013-09-02 NOTE — Telephone Encounter (Signed)
Patient having a hard time trying to get his insulin Do we have any help for him?

## 2013-09-02 NOTE — Progress Notes (Signed)
DIABETES Pt came in to have a retinal scan per diabetic care.   Image was taken and submitted to [facility]. Garg for reading.    Results will be available in 1-2 weeks.  Results will be given to PCP for review and to contact patient.  John Howell  

## 2014-08-25 ENCOUNTER — Encounter (HOSPITAL_COMMUNITY): Payer: Self-pay | Admitting: Emergency Medicine

## 2014-08-25 ENCOUNTER — Emergency Department (HOSPITAL_COMMUNITY)
Admission: EM | Admit: 2014-08-25 | Discharge: 2014-08-25 | Disposition: A | Discharge status: Home / Self Care | Payer: Self-pay | Attending: Emergency Medicine | Admitting: Emergency Medicine

## 2014-08-25 DIAGNOSIS — Z79899 Other long term (current) drug therapy: Secondary | ICD-10-CM | POA: Insufficient documentation

## 2014-08-25 DIAGNOSIS — E119 Type 2 diabetes mellitus without complications: Secondary | ICD-10-CM | POA: Insufficient documentation

## 2014-08-25 DIAGNOSIS — Z8711 Personal history of peptic ulcer disease: Secondary | ICD-10-CM

## 2014-08-25 DIAGNOSIS — R1013 Epigastric pain: Secondary | ICD-10-CM | POA: Insufficient documentation

## 2014-08-25 DIAGNOSIS — Z8719 Personal history of other diseases of the digestive system: Secondary | ICD-10-CM | POA: Insufficient documentation

## 2014-08-25 DIAGNOSIS — Z794 Long term (current) use of insulin: Secondary | ICD-10-CM | POA: Insufficient documentation

## 2014-08-25 DIAGNOSIS — F1010 Alcohol abuse, uncomplicated: Secondary | ICD-10-CM | POA: Insufficient documentation

## 2014-08-25 DIAGNOSIS — Z720 Tobacco use: Secondary | ICD-10-CM | POA: Insufficient documentation

## 2014-08-25 DIAGNOSIS — R110 Nausea: Secondary | ICD-10-CM | POA: Insufficient documentation

## 2014-08-25 HISTORY — DX: Peptic ulcer, site unspecified, unspecified as acute or chronic, without hemorrhage or perforation: K27.9

## 2014-08-25 LAB — COMPREHENSIVE METABOLIC PANEL
ALT: 29 U/L (ref 0–53)
ANION GAP: 7 (ref 5–15)
AST: 38 U/L — AB (ref 0–37)
Albumin: 4 g/dL (ref 3.5–5.2)
Alkaline Phosphatase: 56 U/L (ref 39–117)
BILIRUBIN TOTAL: 0.5 mg/dL (ref 0.3–1.2)
BUN: 9 mg/dL (ref 6–23)
CALCIUM: 9.4 mg/dL (ref 8.4–10.5)
CO2: 29 mmol/L (ref 19–32)
CREATININE: 0.83 mg/dL (ref 0.50–1.35)
Chloride: 103 mmol/L (ref 96–112)
GFR calc non Af Amer: >90 mL/min (ref >90)
GLUCOSE: 157 mg/dL — AB (ref 70–99)
Potassium: 3.6 mmol/L (ref 3.5–5.1)
Sodium: 139 mmol/L (ref 135–145)
Total Protein: 7 g/dL (ref 6.0–8.3)

## 2014-08-25 LAB — CBC WITH DIFFERENTIAL/PLATELET
BASOS PCT: 0 % (ref 0–1)
Basophils Absolute: 0 10*3/uL (ref 0.0–0.1)
Eosinophils Absolute: 0.2 10*3/uL (ref 0.0–0.7)
Eosinophils Relative: 2 % (ref 0–5)
HEMATOCRIT: 41.5 % (ref 39.0–52.0)
HEMOGLOBIN: 14.4 g/dL (ref 13.0–17.0)
LYMPHS ABS: 3 10*3/uL (ref 0.7–4.0)
Lymphocytes Relative: 30 % (ref 12–46)
MCH: 31.4 pg (ref 26.0–34.0)
MCHC: 34.7 g/dL (ref 30.0–36.0)
MCV: 90.4 fL (ref 78.0–100.0)
MONOS PCT: 9 % (ref 3–12)
Monocytes Absolute: 0.9 10*3/uL (ref 0.1–1.0)
NEUTROS ABS: 6 10*3/uL (ref 1.7–7.7)
Neutrophils Relative %: 59 % (ref 43–77)
Platelets: 258 10*3/uL (ref 150–400)
RBC: 4.59 MIL/uL (ref 4.22–5.81)
RDW: 12.7 % (ref 11.5–15.5)
WBC: 10.1 10*3/uL (ref 4.0–10.5)

## 2014-08-25 LAB — LIPASE, BLOOD: LIPASE: 26 U/L (ref 11–59)

## 2014-08-25 MED ORDER — GI COCKTAIL ~~LOC~~
30.0000 mL | Freq: Once | ORAL | Status: AC
  Administered 2014-08-25: 30 mL via ORAL
  Filled 2014-08-25: qty 30

## 2014-08-25 MED ORDER — SUCRALFATE 1 G PO TABS: 1.0000 g | ORAL_TABLET | Freq: Three times a day (TID) | ORAL | Status: DC

## 2014-08-25 MED ORDER — PANTOPRAZOLE SODIUM 20 MG PO TBEC: 20.0000 mg | DELAYED_RELEASE_TABLET | Freq: Every day | ORAL | Status: DC

## 2014-08-25 MED ORDER — HYDROCODONE-ACETAMINOPHEN 5-325 MG PO TABS: 1.0000 | ORAL_TABLET | Freq: Four times a day (QID) | ORAL | Status: DC | PRN

## 2014-08-25 MED ORDER — ONDANSETRON HCL 4 MG/2ML IJ SOLN
4.0000 mg | Freq: Once | INTRAMUSCULAR | Status: AC
  Administered 2014-08-25: 4 mg via INTRAVENOUS
  Filled 2014-08-25: qty 2

## 2014-08-25 MED ORDER — MORPHINE SULFATE 4 MG/ML IJ SOLN
4.0000 mg | Freq: Once | INTRAMUSCULAR | Status: AC
  Administered 2014-08-25: 4 mg via INTRAVENOUS
  Filled 2014-08-25: qty 1

## 2014-08-25 MED ORDER — PANTOPRAZOLE SODIUM 40 MG IV SOLR
40.0000 mg | Freq: Once | INTRAVENOUS | Status: AC
  Administered 2014-08-25: 40 mg via INTRAVENOUS
  Filled 2014-08-25: qty 40

## 2014-08-25 NOTE — Discharge Instructions (Signed)
Peptic Ulcer A peptic ulcer is a sore in the lining of your esophagus (esophageal ulcer), stomach (gastric ulcer), or in the first part of your small intestine (duodenal ulcer). The ulcer causes erosion into the deeper tissue. CAUSES  Normally, the lining of the stomach and the small intestine protects itself from the acid that digests food. The protective lining can be damaged by:  An infection caused by a bacterium called Helicobacter pylori (H. pylori).  Regular use of nonsteroidal anti-inflammatory drugs (NSAIDs), such as ibuprofen or aspirin.  Smoking tobacco. Other risk factors include being older than 50, drinking alcohol excessively, and having a family history of ulcer disease.  SYMPTOMS   Burning pain or gnawing in the area between the chest and the belly button.  Heartburn.  Nausea and vomiting.  Bloating. The pain can be worse on an empty stomach and at night. If the ulcer results in bleeding, it can cause:  Black, tarry stools.  Vomiting of bright red blood.  Vomiting of coffee-ground-looking materials. DIAGNOSIS  A diagnosis is usually made based upon your history and an exam. Other tests and procedures may be performed to find the cause of the ulcer. Finding a cause will help determine the best treatment. Tests and procedures may include:  Blood tests, stool tests, or breath tests to check for the bacterium H. pylori.  An upper gastrointestinal (GI) series of the esophagus, stomach, and small intestine.  An endoscopy to examine the esophagus, stomach, and small intestine.  A biopsy. TREATMENT  Treatment may include:  Eliminating the cause of the ulcer, such as smoking, NSAIDs, or alcohol.  Medicines to reduce the amount of acid in your digestive tract.  Antibiotic medicines if the ulcer is caused by the H. pylori bacterium.  An upper endoscopy to treat a bleeding ulcer.  Surgery if the bleeding is severe or if the ulcer created a hole somewhere in the  digestive system. HOME CARE INSTRUCTIONS   Avoid tobacco, alcohol, and caffeine. Smoking can increase the acid in the stomach, and continued smoking will impair the healing of ulcers.  Avoid foods and drinks that seem to cause discomfort or aggravate your ulcer.  Only take medicines as directed by your caregiver. Do not substitute over-the-counter medicines for prescription medicines without talking to your caregiver.  Keep any follow-up appointments and tests as directed. SEEK MEDICAL CARE IF:   Your do not improve within 7 days of starting treatment.  You have ongoing indigestion or heartburn. SEEK IMMEDIATE MEDICAL CARE IF:   You have sudden, sharp, or persistent abdominal pain.  You have bloody or dark black, tarry stools.  You vomit blood or vomit that looks like coffee grounds.  You become light-headed, weak, or feel faint.  You become sweaty or clammy. MAKE SURE YOU:   Understand these instructions.  Will watch your condition.  Will get help right away if you are not doing well or get worse. Document Released: 05/19/2000 Document Revised: 10/06/2013 Document Reviewed: 12/20/2011 [facility] Patient Information 2015 [place], [place]. This information is not intended to replace advice given to you by your health care provider. Make sure you discuss any questions you have with your health care provider.   Alcohol Use Disorder Alcohol use disorder is a mental disorder. It is not a one-time incident of heavy drinking. Alcohol use disorder is the excessive and uncontrollable use of alcohol over time that leads to problems with functioning in one or more areas of daily living. People with this disorder risk harming  themselves and others when they drink to excess. Alcohol use disorder also can cause other mental disorders, such as mood and anxiety disorders, and serious physical problems. People with alcohol use disorder often misuse other drugs.  Alcohol use disorder is common and  widespread. Some people with this disorder drink alcohol to cope with or escape from negative life events. Others drink to relieve chronic pain or symptoms of mental illness. People with a family history of alcohol use disorder are at higher risk of losing control and using alcohol to excess.  SYMPTOMS  Signs and symptoms of alcohol use disorder may include the following:   Consumption ofalcohol inlarger amounts or over a longer period of time than intended.  Multiple unsuccessful attempts to cutdown or control alcohol use.   A great deal of time spent obtaining alcohol, using alcohol, or recovering from the effects of alcohol (hangover).  A strong desire or urge to use alcohol (cravings).   Continued use of alcohol despite problems at work, school, or home because of alcohol use.   Continued use of alcohol despite problems in relationships because of alcohol use.  Continued use of alcohol in situations when it is physically hazardous, such as driving a car.  Continued use of alcohol despite awareness of a physical or psychological problem that is likely related to alcohol use. Physical problems related to alcohol use can involve the brain, heart, liver, stomach, and intestines. Psychological problems related to alcohol use include intoxication, depression, anxiety, psychosis, delirium, and dementia.   The need for increased amounts of alcohol to achieve the same desired effect, or a decreased effect from the consumption of the same amount of alcohol (tolerance).  Withdrawal symptoms upon reducing or stopping alcohol use, or alcohol use to reduce or avoid withdrawal symptoms. Withdrawal symptoms include:  Racing heart.  Hand tremor.  Difficulty sleeping.  Nausea.  Vomiting.  Hallucinations.  Restlessness.  Seizures. DIAGNOSIS Alcohol use disorder is diagnosed through an assessment by your health care provider. Your health care provider may start by asking three or four  questions to screen for excessive or problematic alcohol use. To confirm a diagnosis of alcohol use disorder, at least two symptoms must be present within a 61-month period. The severity of alcohol use disorder depends on the number of symptoms:  Mild--two or three.  Moderate--four or five.  Severe--six or more. Your health care provider may perform a physical exam or use results from lab tests to see if you have physical problems resulting from alcohol use. Your health care provider may refer you to a mental health professional for evaluation. TREATMENT  Some people with alcohol use disorder are able to reduce their alcohol use to low-risk levels. Some people with alcohol use disorder need to quit drinking alcohol. When necessary, mental health professionals with specialized training in substance use treatment can help. Your health care provider can help you decide how severe your alcohol use disorder is and what type of treatment you need. The following forms of treatment are available:   Detoxification. Detoxification involves the use of prescription medicines to prevent alcohol withdrawal symptoms in the first week after quitting. This is important for people with a history of symptoms of withdrawal and for heavy drinkers who are likely to have withdrawal symptoms. Alcohol withdrawal can be dangerous and, in severe cases, cause death. Detoxification is usually provided in a hospital or in-patient substance use treatment facility.  Counseling or talk therapy. Talk therapy is provided by substance use treatment counselors.  It addresses the reasons people use alcohol and ways to keep them from drinking again. The goals of talk therapy are to help people with alcohol use disorder find healthy activities and ways to cope with life stress, to identify and avoid triggers for alcohol use, and to handle cravings, which can cause relapse.  Medicines.Different medicines can help treat alcohol use disorder  through the following actions:  Decrease alcohol cravings.  Decrease the positive reward response felt from alcohol use.  Produce an uncomfortable physical reaction when alcohol is used (aversion therapy).  Support groups. Support groups are run by people who have quit drinking. They provide emotional support, advice, and guidance. These forms of treatment are often combined. Some people with alcohol use disorder benefit from intensive combination treatment provided by specialized substance use treatment centers. Both inpatient and outpatient treatment programs are available. Document Released: 06/29/2004 Document Revised: 10/06/2013 Document Reviewed: 08/29/2012 [facility] Patient Information 2015 [place]. This information is not intended to replace advice given to you by your health care provider. Make sure you discuss any questions you have with your health care provider.

## 2014-08-25 NOTE — ED Notes (Signed)
Pt. Left with all belongings and refused wheelchair 

## 2014-08-25 NOTE — ED Notes (Signed)
Pt states that he has a hx of stomach ulcers. Pt reports drinking over the weekend, which he states aggravated his ulcers.

## 2014-08-25 NOTE — ED Provider Notes (Signed)
CSN: [id number]     Arrival date & time 08/25/14  7564 History  This chart was scribed for John Hacker, MD by [name], ED Scribe. This patient was seen in room A04C/A04C and the patient's care was started at .    Chief Complaint  Patient presents with  . Abdominal Pain   HPI   HPI Comments: [name] is a 49 y.o. male with past medical history of T2DM, stomach ulcers who presents to the Emergency Department complaining of nonradiating, epigastric abdominal pain beginning 3 days PTA, rated 8/10 at present, 9/10 at its worst. He notes nausea. He took tylenol and Mylanta for pain. He denies vomiting, diarrhea.   He is a daily EtOH user; last drink yesterday.   Past Medical History  Diagnosis Date  . Diabetes mellitus without complication   . Peptic ulcer    History reviewed. No pertinent past surgical history. History reviewed. No pertinent family history. History  Substance Use Topics  . Smoking status: Current Every Day Smoker    Types: Cigarettes  . Smokeless tobacco: Not on file     Comment: 2  a day  . Alcohol Use: 3.6 oz/week    6 Cans of beer per week     Comment: Daily    Review of Systems  Constitutional: Negative.  Negative for fever.  Respiratory: Negative.  Negative for chest tightness and shortness of breath.   Cardiovascular: Negative.  Negative for chest pain.  Gastrointestinal: Positive for nausea and abdominal pain. Negative for vomiting and diarrhea.  Genitourinary: Negative.  Negative for dysuria.  Musculoskeletal: Negative for back pain.  Neurological: Negative for headaches.  All other systems reviewed and are negative.     Allergies  Review of patient's allergies indicates no known allergies.  Home Medications   Prior to Admission medications   Medication Sig Start Date End Date Taking? Authorizing Provider  acetaminophen (TYLENOL) 325 MG tablet Take 650 mg by mouth every 6 (six) hours as needed for mild pain, fever or headache.    Yes Historical Provider, MD  acetaminophen-codeine (TYLENOL #3) 300-30 MG per tablet Take 1 tablet by mouth every 8 (eight) hours as needed for severe pain. 08/23/13  Yes Reyne Dumas, MD  cyclobenzaprine (FLEXERIL) 10 MG tablet Take 1 tablet (10 mg total) by mouth 3 (three) times daily as needed for muscle spasms (and or back pain). 08/23/13  Yes Reyne Dumas, MD  ibuprofen (ADVIL,MOTRIN) 200 MG tablet Take 400 mg by mouth every 6 (six) hours as needed for fever, headache or mild pain.   Yes Historical Provider, MD  lisinopril (PRINIVIL,ZESTRIL) 5 MG tablet Take 5 mg by mouth daily.   Yes Historical Provider, MD  metFORMIN (GLUMETZA) 500 MG (MOD) 24 hr tablet Take 500 mg by mouth daily with breakfast.   Yes Historical Provider, MD  gabapentin (NEURONTIN) 100 MG capsule Take 1 capsule (100 mg total) by mouth 3 (three) times daily. Patient not taking: Reported on 08/25/2014 08/23/13   Reyne Dumas, MD  glucose blood test strip Use as instructed Patient not taking: Reported on 08/25/2014 08/23/13   Reyne Dumas, MD  glucose monitoring kit (FREESTYLE) monitoring kit 1 each by Does not apply route as needed for other. Patient not taking: Reported on 08/25/2014 08/23/13   Reyne Dumas, MD  HYDROcodone-acetaminophen (NORCO/VICODIN) 5-325 MG per tablet Take 1 tablet by mouth every 6 (six) hours as needed. 08/25/14   John Hacker, MD  insulin NPH-regular Human (NOVOLIN 70/30) (70-30) 100 UNIT/ML  injection 25 units in the morning and 25 units in the evening Patient not taking: Reported on 08/25/2014 08/23/13   Reyne Dumas, MD  pantoprazole (PROTONIX) 20 MG tablet Take 1 tablet (20 mg total) by mouth daily. 08/25/14   John Hacker, MD  sucralfate (CARAFATE) 1 G tablet Take 1 tablet (1 g total) by mouth 4 (four) times daily -  with meals and at bedtime. 08/25/14   John Hacker, MD   BP 123/86 mmHg  Pulse 75  Resp 18  SpO2 100% Physical Exam  Constitutional: He is oriented to person, place, and time.  He appears well-developed and well-nourished.  HENT:  Head: Normocephalic and atraumatic.  Eyes: Pupils are equal, round, and reactive to light.  Cardiovascular: Normal rate, regular rhythm and normal heart sounds.   No murmur heard. Pulmonary/Chest: Effort normal and breath sounds normal. No respiratory distress. He has no wheezes.  Abdominal: Soft. Bowel sounds are normal. There is no rebound.  Minimal tenderness to palpation without rebound or guarding of the epigastrium  Musculoskeletal: He exhibits no edema.  Neurological: He is alert and oriented to person, place, and time.  Skin: Skin is warm and dry.  Psychiatric: He has a normal mood and affect.  Nursing note and vitals reviewed.   ED Course  Procedures   DIAGNOSTIC STUDIES: Oxygen Saturation is 100% on RA,normal by my interpretation.    COORDINATION OF CARE: 3:46 AM Discussed treatment plan with pt at bedside and pt agreed to plan.   Labs Review Labs Reviewed  COMPREHENSIVE METABOLIC PANEL - Abnormal; Notable for the following:    Glucose, Bld 157 (*)    AST 38 (*)    All other components within normal limits  CBC WITH DIFFERENTIAL/PLATELET  LIPASE, BLOOD    Imaging Review No results found.   EKG Interpretation None      MDM   Final diagnoses:  Epigastric pain  History of peptic ulcer disease  Alcohol abuse   Patient presents with epigastric abdominal pain. History of peptic ulcer disease and alcohol abuse. Nontoxic on exam. No signs of peritonitis. Basic labwork obtained and reassuring. Patient given GI cocktail, Protonix, and pain medication. On reexam, continues to be reassuring. Suspect acute gastritis versus peptic ulcer disease as the source of the patient's pain. Discussed with patient alcohol cessation.  After history, exam, and medical workup I feel the patient has been appropriately medically screened and is safe for discharge home. Pertinent diagnoses were discussed with the patient. Patient  was given return precautions.   I personally performed the services described in this documentation, which was scribed in my presence. The recorded information has been reviewed and is accurate.      John Hacker, MD 08/25/14 [phone]

## 2014-09-14 ENCOUNTER — Emergency Department (INDEPENDENT_AMBULATORY_CARE_PROVIDER_SITE_OTHER)
Admission: EM | Admit: 2014-09-14 | Discharge: 2014-09-14 | Disposition: A | Discharge status: Home / Self Care | Payer: Self-pay | Source: Home / Self Care | Attending: Emergency Medicine | Admitting: Emergency Medicine

## 2014-09-14 ENCOUNTER — Encounter (HOSPITAL_COMMUNITY): Payer: Self-pay

## 2014-09-14 DIAGNOSIS — K2921 Alcoholic gastritis with bleeding: Secondary | ICD-10-CM

## 2014-09-14 DIAGNOSIS — K279 Peptic ulcer, site unspecified, unspecified as acute or chronic, without hemorrhage or perforation: Secondary | ICD-10-CM

## 2014-09-14 LAB — POCT I-STAT, CHEM 8
BUN: 9 mg/dL (ref 6–23)
CHLORIDE: 104 mmol/L (ref 96–112)
CREATININE: 0.8 mg/dL (ref 0.50–1.35)
Calcium, Ion: 1.22 mmol/L (ref 1.12–1.23)
GLUCOSE: 123 mg/dL — AB (ref 70–99)
HCT: 46 % (ref 39.0–52.0)
Hemoglobin: 15.6 g/dL (ref 13.0–17.0)
POTASSIUM: 4 mmol/L (ref 3.5–5.1)
SODIUM: 143 mmol/L (ref 135–145)
TCO2: 24 mmol/L (ref 0–100)

## 2014-09-14 LAB — OCCULT BLOOD, POC DEVICE: Fecal Occult Bld: POSITIVE — AB

## 2014-09-14 MED ORDER — ONDANSETRON 4 MG PO TBDP
ORAL_TABLET | ORAL | Status: AC
  Filled 2014-09-14: qty 1

## 2014-09-14 MED ORDER — GI COCKTAIL ~~LOC~~
ORAL | Status: AC
  Filled 2014-09-14: qty 30

## 2014-09-14 MED ORDER — GI COCKTAIL ~~LOC~~
30.0000 mL | Freq: Once | ORAL | Status: AC
  Administered 2014-09-14: 30 mL via ORAL

## 2014-09-14 MED ORDER — SUCRALFATE 1 G PO TABS: 1.0000 g | ORAL_TABLET | Freq: Three times a day (TID) | ORAL | Status: DC

## 2014-09-14 MED ORDER — ONDANSETRON 4 MG PO TBDP
4.0000 mg | ORAL_TABLET | Freq: Once | ORAL | Status: AC
  Administered 2014-09-14: 4 mg via ORAL

## 2014-09-14 NOTE — Discharge Instructions (Signed)
Gastritis, Adult Gastritis is soreness and puffiness (inflammation) of the lining of the stomach. If you do not get help, gastritis can cause bleeding and sores (ulcers) in the stomach. HOME CARE   Only take medicine as told by your doctor.  If you were given antibiotic medicines, take them as told. Finish the medicines even if you start to feel better.  Drink enough fluids to keep your pee (urine) clear or pale yellow.  Avoid foods and drinks that make your problems worse. Foods you may want to avoid include:  Caffeine or alcohol.  Chocolate.  Mint.  Garlic and onions.  Spicy foods.  Citrus fruits, including oranges, lemons, or limes.  Food containing tomatoes, including sauce, chili, salsa, and pizza.  Fried and fatty foods.  Eat small meals throughout the day instead of large meals. GET HELP RIGHT AWAY IF:   You have black or dark red poop (stools).  You throw up (vomit) blood. It may look like coffee grounds.  You cannot keep fluids down.  Your belly (abdominal) pain gets worse.  You have a fever.  You do not feel better after 1 week.  You have any other questions or concerns. MAKE SURE YOU:   Understand these instructions.  Will watch your condition.  Will get help right away if you are not doing well or get worse. Document Released: 11/08/2007 Document Revised: 08/14/2011 Document Reviewed: 07/05/2011 [facility] Patient Information 2015 [place], [place]. This information is not intended to replace advice given to you by your health care provider. Make sure you discuss any questions you have with your health care provider.  Gastrointestinal Bleeding Gastrointestinal bleeding is bleeding somewhere along the path that food travels through the body (digestive tract). This path is anywhere between the mouth and the opening of the butt (anus). You may have blood in your throw up (vomit) or in your poop (stools). If there is a lot of bleeding, you may need to stay  in the hospital. HOME CARE  Only take medicine as told by your doctor.  Eat foods with fiber such as whole grains, fruits, and vegetables. You can also try eating 1 to 3 prunes a day.  Drink enough fluids to keep your pee (urine) clear or pale yellow. GET HELP RIGHT AWAY IF:   Your bleeding gets worse.  You feel dizzy, weak, or you pass out (faint).  You have bad cramps in your back or belly (abdomen).  You have large blood clumps (clots) in your poop.  Your problems are getting worse. MAKE SURE YOU:   Understand these instructions.  Will watch your condition.  Will get help right away if you are not doing well or get worse. Document Released: 02/29/2008 Document Revised: 05/08/2012 Document Reviewed: 05/01/2011 [facility] Patient Information 2015 [place], [place]. This information is not intended to replace advice given to you by your health care provider. Make sure you discuss any questions you have with your health care provider.  Peptic Ulcer A peptic ulcer is a sore in the lining of your esophagus (esophageal ulcer), stomach (gastric ulcer), or in the first part of your small intestine (duodenal ulcer). The ulcer causes erosion into the deeper tissue. CAUSES  Normally, the lining of the stomach and the small intestine protects itself from the acid that digests food. The protective lining can be damaged by:  An infection caused by a bacterium called Helicobacter pylori (H. pylori).  Regular use of nonsteroidal anti-inflammatory drugs (NSAIDs), such as ibuprofen or aspirin.  Smoking tobacco.  Other risk factors include being older than 50, drinking alcohol excessively, and having a family history of ulcer disease.  SYMPTOMS   Burning pain or gnawing in the area between the chest and the belly button.  Heartburn.  Nausea and vomiting.  Bloating. The pain can be worse on an empty stomach and at night. If the ulcer results in bleeding, it can cause:  Black, tarry  stools.  Vomiting of bright red blood.  Vomiting of coffee-ground-looking materials. DIAGNOSIS  A diagnosis is usually made based upon your history and an exam. Other tests and procedures may be performed to find the cause of the ulcer. Finding a cause will help determine the best treatment. Tests and procedures may include:  Blood tests, stool tests, or breath tests to check for the bacterium H. pylori.  An upper gastrointestinal (GI) series of the esophagus, stomach, and small intestine.  An endoscopy to examine the esophagus, stomach, and small intestine.  A biopsy. TREATMENT  Treatment may include:  Eliminating the cause of the ulcer, such as smoking, NSAIDs, or alcohol.  Medicines to reduce the amount of acid in your digestive tract.  Antibiotic medicines if the ulcer is caused by the H. pylori bacterium.  An upper endoscopy to treat a bleeding ulcer.  Surgery if the bleeding is severe or if the ulcer created a hole somewhere in the digestive system. HOME CARE INSTRUCTIONS   Avoid tobacco, alcohol, and caffeine. Smoking can increase the acid in the stomach, and continued smoking will impair the healing of ulcers.  Avoid foods and drinks that seem to cause discomfort or aggravate your ulcer.  Only take medicines as directed by your caregiver. Do not substitute over-the-counter medicines for prescription medicines without talking to your caregiver.  Keep any follow-up appointments and tests as directed. SEEK MEDICAL CARE IF:   Your do not improve within 7 days of starting treatment.  You have ongoing indigestion or heartburn. SEEK IMMEDIATE MEDICAL CARE IF:   You have sudden, sharp, or persistent abdominal pain.  You have bloody or dark black, tarry stools.  You vomit blood or vomit that looks like coffee grounds.  You become light-headed, weak, or feel faint.  You become sweaty or clammy. MAKE SURE YOU:   Understand these instructions.  Will watch your  condition.  Will get help right away if you are not doing well or get worse. Document Released: 05/19/2000 Document Revised: 10/06/2013 Document Reviewed: 12/20/2011 [facility] Patient Information 2015 [place], [place]. This information is not intended to replace advice given to you by your health care provider. Make sure you discuss any questions you have with your health care provider.

## 2014-09-14 NOTE — ED Provider Notes (Signed)
CSN: [id number]     Arrival date & time 09/14/14  0906 History   First MD Initiated Contact with Patient 09/14/14 [phone]     Chief Complaint  Patient presents with  . Rectal Bleeding  . Abdominal Pain   (Consider location/radiation/quality/duration/timing/severity/associated sxs/prior Treatment) HPI Comments: 49 year old male with known history of PUD and alcoholic gastritis presents with epigastric pain that started yesterday. It became worse after he had a couple of beers yesterday. He also witnessed dark stools that he believes was blood. He was seen in the emergency department for nearly identical symptoms approximately 2 weeks ago. His labs within normal limits they treated him with GI cocktail and a pain medicine. (Medication no on PMH for visit to the ER reveals a prescription for Tylenol 3 #60 tablets that did not show up on [facility] site,        and hydrocodone 5 mg No. 10 tablets). He was considered to be stable and was discharged home. He did not follow-up with his PCP. And he is not taking medications for which she was prescribed on that visit. He denies vomiting. As also a type II diabetic treated with metformin.   Past Medical History  Diagnosis Date  . Diabetes mellitus without complication   . Peptic ulcer    History reviewed. No pertinent past surgical history. History reviewed. No pertinent family history. History  Substance Use Topics  . Smoking status: Current Every Day Smoker    Types: Cigarettes  . Smokeless tobacco: Not on file     Comment: 2  a day  . Alcohol Use: 3.6 oz/week    6 Cans of beer per week     Comment: Daily    Review of Systems  Constitutional: Positive for activity change. Negative for fever and fatigue.  HENT: Negative.   Respiratory: Negative for cough, choking and shortness of breath.   Cardiovascular: Negative for chest pain.  Gastrointestinal: Positive for nausea, abdominal pain and blood in stool. Negative for vomiting, diarrhea,  abdominal distention and rectal pain.  Genitourinary: Negative.   Skin: Negative.   Neurological: Negative.     Allergies  Review of patient's allergies indicates no known allergies.  Home Medications   Prior to Admission medications   Medication Sig Start Date End Date Taking? Authorizing Provider  acetaminophen (TYLENOL) 325 MG tablet Take 650 mg by mouth every 6 (six) hours as needed for mild pain, fever or headache.    Historical Provider, MD  acetaminophen-codeine (TYLENOL #3) 300-30 MG per tablet Take 1 tablet by mouth every 8 (eight) hours as needed for severe pain. 08/23/13   Reyne Dumas, MD  cyclobenzaprine (FLEXERIL) 10 MG tablet Take 1 tablet (10 mg total) by mouth 3 (three) times daily as needed for muscle spasms (and or back pain). 08/23/13   Reyne Dumas, MD  glucose blood test strip Use as instructed Patient not taking: Reported on 08/25/2014 08/23/13   Reyne Dumas, MD  glucose monitoring kit (FREESTYLE) monitoring kit 1 each by Does not apply route as needed for other. Patient not taking: Reported on 08/25/2014 08/23/13   Reyne Dumas, MD  HYDROcodone-acetaminophen (NORCO/VICODIN) 5-325 MG per tablet Take 1 tablet by mouth every 6 (six) hours as needed. 08/25/14   [name], MD  ibuprofen (ADVIL,MOTRIN) 200 MG tablet Take 400 mg by mouth every 6 (six) hours as needed for fever, headache or mild pain.    Historical Provider, MD  insulin NPH-regular Human (NOVOLIN 70/30) (70-30) 100 UNIT/ML injection 25  units in the morning and 25 units in the evening Patient not taking: Reported on 08/25/2014 08/23/13   Reyne Dumas, MD  lisinopril (PRINIVIL,ZESTRIL) 5 MG tablet Take 5 mg by mouth daily.    Historical Provider, MD  metFORMIN (GLUMETZA) 500 MG (MOD) 24 hr tablet Take 500 mg by mouth daily with breakfast.    Historical Provider, MD  pantoprazole (PROTONIX) 20 MG tablet Take 1 tablet (20 mg total) by mouth daily. 08/25/14   [name], MD  sucralfate (CARAFATE) 1 G  tablet Take 1 tablet (1 g total) by mouth 4 (four) times daily -  with meals and at bedtime. 09/14/14   Janne Napoleon, NP   BP 114/84 mmHg  Pulse 66  Temp(Src) 98.1 F (36.7 C) (Oral)  Resp 18  SpO2 100% Physical Exam  Constitutional: He is oriented to person, place, and time. He appears well-developed and well-nourished. No distress.  Neck: Normal range of motion. Neck supple.  Cardiovascular: Normal rate, regular rhythm and normal heart sounds.   Pulmonary/Chest: Effort normal and breath sounds normal. No respiratory distress. He has no wheezes.  Abdominal: Soft. Bowel sounds are normal.  Epigastric tenderness. No rebound. No tenderness to the lower abdomen.  Genitourinary: Guaiac positive stool.  No stool in the rectal vault. Prostate soft boggy and mildly enlarged.  Neurological: He is alert and oriented to person, place, and time. No cranial nerve deficit. Coordination normal.  Skin: Skin is warm and dry.  Psychiatric: He has a normal mood and affect.  Nursing note and vitals reviewed.   ED Course  Procedures (including critical care time) Labs Review Labs Reviewed  POCT I-STAT, CHEM 8 - Abnormal; Notable for the following:    Glucose, Bld 123 (*)    All other components within normal limits  OCCULT BLOOD, POC DEVICE - Abnormal; Notable for the following:    Fecal Occult Bld POSITIVE (*)    All other components within normal limits   Results for orders placed or performed during the hospital encounter of 09/14/14  I-STAT, chem 8  Result Value Ref Range   Sodium 143 135 - 145 mmol/L   Potassium 4.0 3.5 - 5.1 mmol/L   Chloride 104 96 - 112 mmol/L   BUN 9 6 - 23 mg/dL   Creatinine, Ser 0.80 0.50 - 1.35 mg/dL   Glucose, Bld 123 (H) 70 - 99 mg/dL   Calcium, Ion 1.22 1.12 - 1.23 mmol/L   TCO2 24 0 - 100 mmol/L   Hemoglobin 15.6 13.0 - 17.0 g/dL   HCT 46.0 39.0 - 52.0 %  Occult blood, poc device  Result Value Ref Range   Fecal Occult Bld POSITIVE (A) NEGATIVE      Imaging Review No results found.   MDM   1. PUD (peptic ulcer disease)   2. 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 He is had partial relief with GI cocktail and Zofran. He has no vomiting. He states he is out of Carafate set that will be refilled Continue taking the Protonix daily Stop drinking and stop smoking Continue checking her blood sugars and need follow-up for diabetes management as well as sure stomach problems. See her PCP as soon as possible call for appointment today.    Janne Napoleon, NP 09/14/14 1101

## 2014-09-14 NOTE — ED Notes (Addendum)
Reported history of stomach ulcers. States he drank a beer last PM, had blood in stool today

## 2014-12-01 ENCOUNTER — Encounter (HOSPITAL_COMMUNITY): Payer: Self-pay | Admitting: Emergency Medicine

## 2014-12-01 ENCOUNTER — Emergency Department (HOSPITAL_COMMUNITY)
Admission: EM | Admit: 2014-12-01 | Discharge: 2014-12-02 | Disposition: A | Discharge status: Home / Self Care | Payer: Self-pay | Attending: Emergency Medicine | Admitting: Emergency Medicine

## 2014-12-01 DIAGNOSIS — Z79899 Other long term (current) drug therapy: Secondary | ICD-10-CM | POA: Insufficient documentation

## 2014-12-01 DIAGNOSIS — Z720 Tobacco use: Secondary | ICD-10-CM | POA: Insufficient documentation

## 2014-12-01 DIAGNOSIS — Y939 Activity, unspecified: Secondary | ICD-10-CM | POA: Insufficient documentation

## 2014-12-01 DIAGNOSIS — Z8719 Personal history of other diseases of the digestive system: Secondary | ICD-10-CM | POA: Insufficient documentation

## 2014-12-01 DIAGNOSIS — S31159A Open bite of abdominal wall, unspecified quadrant without penetration into peritoneal cavity, initial encounter: Secondary | ICD-10-CM | POA: Insufficient documentation

## 2014-12-01 DIAGNOSIS — Z794 Long term (current) use of insulin: Secondary | ICD-10-CM | POA: Insufficient documentation

## 2014-12-01 DIAGNOSIS — S30811A Abrasion of abdominal wall, initial encounter: Secondary | ICD-10-CM | POA: Insufficient documentation

## 2014-12-01 DIAGNOSIS — Z23 Encounter for immunization: Secondary | ICD-10-CM | POA: Insufficient documentation

## 2014-12-01 DIAGNOSIS — Y999 Unspecified external cause status: Secondary | ICD-10-CM | POA: Insufficient documentation

## 2014-12-01 DIAGNOSIS — W540XXA Bitten by dog, initial encounter: Secondary | ICD-10-CM | POA: Insufficient documentation

## 2014-12-01 DIAGNOSIS — E119 Type 2 diabetes mellitus without complications: Secondary | ICD-10-CM | POA: Insufficient documentation

## 2014-12-01 DIAGNOSIS — Y929 Unspecified place or not applicable: Secondary | ICD-10-CM | POA: Insufficient documentation

## 2014-12-01 MED ORDER — TETANUS-DIPHTH-ACELL PERTUSSIS 5-2.5-18.5 LF-MCG/0.5 IM SUSP
0.5000 mL | Freq: Once | INTRAMUSCULAR | Status: AC
  Administered 2014-12-02: 0.5 mL via INTRAMUSCULAR
  Filled 2014-12-01: qty 0.5

## 2014-12-01 NOTE — ED Provider Notes (Signed)
CSN: [id number]     Arrival date & time 12/01/14  2318 History  This chart was scribed for non-physician practitioner, Zacarias Pontes, PA-C working with Delora Fuel, MD, by Chester Holstein, ED Scribe. This patient was seen in room TR08C/TR08C and the patient's care was started at 11:49 PM.       Chief Complaint  Patient presents with  . Animal Bite      Patient is a 49 y.o. male presenting with animal bite. The history is provided by the patient. No language interpreter was used.  Animal Bite Contact animal:  Dog Location:  Torso Torso injury location:  Abd LLQ Time since incident:  1 hour Pain details:    Quality:  Stinging   Severity:  Moderate   Timing:  Constant   Progression:  Unchanged Incident location:  Another residence Provoked: unprovoked   Notifications:  None Animal's rabies vaccination status:  Up to date Animal in possession: yes (his neighbor)   Tetanus status:  Out of date Relieved by:  None tried Worsened by:  Nothing tried Ineffective treatments:  None tried Associated symptoms: no fever, no numbness and no swelling   HPI Comments: Davonta Stroot is a 49 y.o. male with PMHx of NIDDM, who presents to the Emergency Department complaining of intermittent 5/10 stinging dog bite to lateral left lower abdomen, nonradiating, with onset 1 hour ago, with no aggravating or alleviating factors given that he hasn't tried anything. The dog belongs to a neighbor who states that the dog has had its rabies shots. Pt has not contacted animal control yet. No active bleeding. Pt's tetanus is not UTD. Pt denies bites anywhere else, fever, chills, nausea, vomiting, diarrhea, abdominal pain, chest pain, SOB, difficulty urinating, dysuria, hematuria, numbness, tingling, and weakness.  No bleeding conditions.  Past Medical History  Diagnosis Date  . Diabetes mellitus without complication   . Peptic ulcer    History reviewed. No pertinent past surgical history. No family  history on file. History  Substance Use Topics  . Smoking status: Current Every Day Smoker    Types: Cigarettes  . Smokeless tobacco: Not on file     Comment: 2  a day  . Alcohol Use: Yes    Review of Systems  Constitutional: Negative for fever and chills.  Respiratory: Negative for shortness of breath.   Cardiovascular: Negative for chest pain.  Gastrointestinal: Negative for nausea, vomiting, abdominal pain and diarrhea.  Genitourinary: Negative for dysuria, hematuria and difficulty urinating.  Musculoskeletal: Negative for myalgias and arthralgias.  Skin: Positive for wound. Negative for color change.  Allergic/Immunologic: Positive for immunocompromised state (diabetic).  Neurological: Negative for weakness and numbness.  Hematological: Does not bruise/bleed easily.   10 Systems reviewed and are negative for acute change except as noted in the HPI.    Allergies  Review of patient's allergies indicates no known allergies.  Home Medications   Prior to Admission medications   Medication Sig Start Date End Date Taking? Authorizing Provider  acetaminophen (TYLENOL) 325 MG tablet Take 650 mg by mouth every 6 (six) hours as needed for mild pain, fever or headache.    Historical Provider, MD  acetaminophen-codeine (TYLENOL #3) 300-30 MG per tablet Take 1 tablet by mouth every 8 (eight) hours as needed for severe pain. 08/23/13   Reyne Dumas, MD  cyclobenzaprine (FLEXERIL) 10 MG tablet Take 1 tablet (10 mg total) by mouth 3 (three) times daily as needed for muscle spasms (and or back pain). 08/23/13   Reyne Dumas,  MD  glucose blood test strip Use as instructed Patient not taking: Reported on 08/25/2014 08/23/13   Reyne Dumas, MD  glucose monitoring kit (FREESTYLE) monitoring kit 1 each by Does not apply route as needed for other. Patient not taking: Reported on 08/25/2014 08/23/13   Reyne Dumas, MD  HYDROcodone-acetaminophen (NORCO/VICODIN) 5-325 MG per tablet Take 1 tablet by mouth  every 6 (six) hours as needed. 08/25/14   [name], MD  ibuprofen (ADVIL,MOTRIN) 200 MG tablet Take 400 mg by mouth every 6 (six) hours as needed for fever, headache or mild pain.    Historical Provider, MD  insulin NPH-regular Human (NOVOLIN 70/30) (70-30) 100 UNIT/ML injection 25 units in the morning and 25 units in the evening Patient not taking: Reported on 08/25/2014 08/23/13   Reyne Dumas, MD  lisinopril (PRINIVIL,ZESTRIL) 5 MG tablet Take 5 mg by mouth daily.    Historical Provider, MD  metFORMIN (GLUMETZA) 500 MG (MOD) 24 hr tablet Take 500 mg by mouth daily with breakfast.    Historical Provider, MD  pantoprazole (PROTONIX) 20 MG tablet Take 1 tablet (20 mg total) by mouth daily. 08/25/14   [name], MD  sucralfate (CARAFATE) 1 G tablet Take 1 tablet (1 g total) by mouth 4 (four) times daily -  with meals and at bedtime. 09/14/14   Janne Napoleon, NP   BP 115/71 mmHg  Pulse 83  Temp(Src) 97.8 F (36.6 C) (Oral)  Resp 18  Ht 6' (1.829 m)  Wt 161 lb 1.6 oz (73.074 kg)  BMI 21.84 kg/m2  SpO2 99% Physical Exam  Constitutional: He is oriented to person, place, and time. Vital signs are normal. He appears well-developed and well-nourished.  Non-toxic appearance. No distress.  Afebrile, nontoxic, NAD  HENT:  Head: Normocephalic and atraumatic.  Mouth/Throat: Mucous membranes are normal.  Eyes: Conjunctivae and EOM are normal. Right eye exhibits no discharge. Left eye exhibits no discharge.  Neck: Normal range of motion. Neck supple.  Cardiovascular: Normal rate.   Pulmonary/Chest: Effort normal. No respiratory distress.  Abdominal: Normal appearance. He exhibits no distension.  Musculoskeletal: Normal range of motion.  Neurological: He is alert and oriented to person, place, and time. He has normal strength. No sensory deficit.  Skin: Skin is warm and dry. Abrasion noted. No rash noted.  Small ~0.5cm abrasion to L lateral abdomen, no bleeding, no gaping wound or extension  beyond epidermis, no surrounding erythema or warmth.   Psychiatric: He has a normal mood and affect.  Nursing note and vitals reviewed.   ED Course  Procedures (including critical care time) DIAGNOSTIC STUDIES: Oxygen Saturation is 99% on room air, normal by my interpretation.    COORDINATION OF CARE: 11:54 PM Discussed treatment plan with patient at beside, the patient agrees with the plan and has no further questions at this time.   Labs Review Labs Reviewed - No data to display  Imaging Review No results found.   EKG Interpretation None      MDM   Final diagnoses:  Animal bite of abdomen, initial encounter    49 y.o. male here with animal bite, unprovoked, onset 1hr PTA. Minimal skin injury to L lateral abdomen. Dog is in possession of neighbor, stated it was UTD on rabies. Will update tetanus and contact animal control. Doubt need for rabies vaccines since animal is in possession of his neighbor but will attempt to contact animal control tonight. Declines pain meds. Will reassess shortly.   I personally performed the services  described in this documentation, which was scribed in my presence. The recorded information has been reviewed and is accurate.  1:03 AM [employer] officer took down information for pt, animal control will contact him tomorrow regarding rabies vaccination status vs quarantining animal. Doubt need for rabies vaccine  Here. Tetanus updated. Will send home with augmentin as precautionary measure since pt is diabetic and this was an abrasion from a dog bite (albeit nonpenetrating). Discussed ice/heat/tylenol/motrin and f/up with PCP in 2-3 days for wound recheck. I explained the diagnosis and have given explicit precautions to return to the ER including for any other new or worsening symptoms. The patient understands and accepts the medical plan as it's been dictated and I have answered their questions. Discharge instructions concerning home care and prescriptions  have been given. The patient is STABLE and is discharged to home in good condition.  BP 115/71 mmHg  Pulse 83  Temp(Src) 97.8 F (36.6 C) (Oral)  Resp 18  Ht 6' (1.829 m)  Wt 161 lb 1.6 oz (73.074 kg)  BMI 21.84 kg/m2  SpO2 99%  Meds ordered this encounter  Medications  . Tdap (BOOSTRIX) injection 0.5 mL    Sig:   . amoxicillin-clavulanate (AUGMENTIN) 875-125 MG per tablet    Sig: Take 1 tablet by mouth 2 (two) times daily. One po bid x 7 days    Dispense:  14 tablet    Refill:  0      Deryck Hippler Camprubi-Soms, PA-C 17/79/39 0300  Delora Fuel, MD 92/33/00 [id number]

## 2014-12-01 NOTE — ED Notes (Signed)
Pt. presents with dog bite mark at left lateral/low abdomen with no bleeding this evening . Pt. unsure if dog's immunizations are complete.

## 2014-12-01 NOTE — ED Notes (Signed)
Pod B Scientist, research (physical sciences)secretary paged animal control to PA Camrubi's phone

## 2014-12-02 MED ORDER — AMOXICILLIN-POT CLAVULANATE 875-125 MG PO TABS: 1.0000 | ORAL_TABLET | Freq: Two times a day (BID) | ORAL | Status: DC

## 2014-12-02 NOTE — Discharge Instructions (Signed)
Keep wound clean with mild soap and water. Keep area covered with a topical antibiotic ointment and bandage, keep bandage dry. Ice the area for additional pain relief and swelling. Alternate between ibuprofen and Tylenol for additional pain relief. Take antibiotic as directed. Follow up with animal control regarding the rabies vaccination status of the animal. Follow up with your primary care doctor in approximately 2-3 days for wound recheck. Monitor area for signs of infection to include, but not limited to: increasing pain, redness, drainage/pus, or swelling. Return to emergency department for emergent changing or worsening symptoms.    Animal Bite An animal bite can result in a scratch on the skin, deep open cut, puncture of the skin, crush injury, or tearing away of the skin or a body part. Dogs are responsible for most animal bites. Children are bitten more often than adults. An animal bite can range from very mild to more serious. A small bite from your house pet is no cause for alarm. However, some animal bites can become infected or injure a bone or other tissue. You must seek medical care if:  The skin is broken and bleeding does not slow down or stop after 15 minutes.  The puncture is deep and difficult to clean (such as a cat bite).  Pain, warmth, redness, or pus develops around the wound.  The bite is from a stray animal or rodent. There may be a risk of rabies infection.  The bite is from a snake, raccoon, skunk, fox, coyote, or bat. There may be a risk of rabies infection.  The person bitten has a chronic illness such as diabetes, liver disease, or cancer, or the person takes medicine that lowers the immune system.  There is concern about the location and severity of the bite. It is important to clean and protect an animal bite wound right away to prevent infection. Follow these steps:  Clean the wound with plenty of water and soap.  Apply an antibiotic cream.  Apply gentle  pressure over the wound with a clean towel or gauze to slow or stop bleeding.  Elevate the affected area above the heart to help stop any bleeding.  Seek medical care. Getting medical care within 8 hours of the animal bite leads to the best possible outcome. DIAGNOSIS  Your caregiver will most likely:  Take a detailed history of the animal and the bite injury.  Perform a wound exam.  Take your medical history. Blood tests or X-rays may be performed. Sometimes, infected bite wounds are cultured and sent to a lab to identify the infectious bacteria.  TREATMENT  Medical treatment will depend on the location and type of animal bite as well as the patient's medical history. Treatment may include:  Wound care, such as cleaning and flushing the wound with saline solution, bandaging, and elevating the affected area.  Antibiotics.  Tetanus immunization.  Rabies immunization.  Leaving the wound open to heal. This is often done with animal bites, due to the high risk of infection. However, in certain cases, wound closure with stitches, wound adhesive, skin adhesive strips, or staples may be used. Infected bites that are left untreated may require intravenous (IV) antibiotics and surgical treatment in the hospital. HOME CARE INSTRUCTIONS  Follow your caregiver's instructions for wound care.  Take all medicines as directed.  If your caregiver prescribes antibiotics, take them as directed. Finish them even if you start to feel better.  Follow up with your caregiver for further exams or immunizations as  directed. You may need a tetanus shot if:  You cannot remember when you had your last tetanus shot.  You have never had a tetanus shot.  The injury broke your skin. If you get a tetanus shot, your arm may swell, get red, and feel warm to the touch. This is common and not a problem. If you need a tetanus shot and you choose not to have one, there is a rare chance of getting tetanus.  Sickness from tetanus can be serious. SEEK MEDICAL CARE IF:  You notice warmth, redness, soreness, swelling, pus discharge, or a bad smell coming from the wound.  You have a red line on the skin coming from the wound.  You have a fever, chills, or a general ill feeling.  You have nausea or vomiting.  You have continued or worsening pain.  You have trouble moving the injured part.  You have other questions or concerns. MAKE SURE YOU:  Understand these instructions.  Will watch your condition.  Will get help right away if you are not doing well or get worse. Document Released: 02/07/2011 Document Revised: 08/14/2011 Document Reviewed: 02/07/2011 [facility]Exit[facility] Patient Information 2015 [place], [place]. This information is not intended to replace advice given to you by your health care provider. Make sure you discuss any questions you have with your health care provider.  Wound Care Wound care helps prevent pain and infection.  You may need a tetanus shot if:  You cannot remember when you had your last tetanus shot.  You have never had a tetanus shot.  The injury broke your skin. If you need a tetanus shot and you choose not to have one, you may get tetanus. Sickness from tetanus can be serious. HOME CARE   Only take medicine as told by your doctor.  Clean the wound daily with mild soap and water.  Change any bandages (dressings) as told by your doctor.  Put medicated cream and a bandage on the wound as told by your doctor.  Change the bandage if it gets wet, dirty, or starts to smell.  Take showers. Do not take baths, swim, or do anything that puts your wound under water.  Rest and raise (elevate) the wound until the pain and puffiness (swelling) are better.  Keep all doctor visits as told. GET HELP RIGHT AWAY IF:   Yellowish-white fluid (pus) comes from the wound.  Medicine does not lessen your pain.  There is a red streak going away from the wound.  You have a  fever. MAKE SURE YOU:   Understand these instructions.  Will watch your condition.  Will get help right away if you are not doing well or get worse. Document Released: 02/29/2008 Document Revised: 08/14/2011 Document Reviewed: 09/25/2010 [facility] Patient Information 2015 [place], [place]. This information is not intended to replace advice given to you by your health care provider. Make sure you discuss any questions you have with your health care provider.

## 2014-12-02 NOTE — ED Notes (Signed)
[employer] at bedside 

## 2014-12-02 NOTE — ED Notes (Signed)
Pt stating he has to leave right away, could not wait for this RN to discharge him. Pt left without signing or discharge papers.

## 2014-12-02 NOTE — ED Notes (Signed)
Mr John Howell was called today to return to Emergency Dept . To pick up RX for Augmentin. Pt instructed he can pick up RX at [facility] in Emergency Lobby. Pt voices the understanding of the need to take the anti-bx.

## 2015-01-04 ENCOUNTER — Emergency Department (HOSPITAL_COMMUNITY)
Admission: EM | Admit: 2015-01-04 | Discharge: 2015-01-04 | Disposition: A | Discharge status: Home / Self Care | Payer: Worker's Compensation | Attending: Emergency Medicine | Admitting: Emergency Medicine

## 2015-01-04 ENCOUNTER — Encounter (HOSPITAL_COMMUNITY): Payer: Self-pay | Admitting: *Deleted

## 2015-01-04 DIAGNOSIS — Z794 Long term (current) use of insulin: Secondary | ICD-10-CM | POA: Insufficient documentation

## 2015-01-04 DIAGNOSIS — X58XXXA Exposure to other specified factors, initial encounter: Secondary | ICD-10-CM | POA: Insufficient documentation

## 2015-01-04 DIAGNOSIS — Z79899 Other long term (current) drug therapy: Secondary | ICD-10-CM | POA: Insufficient documentation

## 2015-01-04 DIAGNOSIS — E119 Type 2 diabetes mellitus without complications: Secondary | ICD-10-CM | POA: Insufficient documentation

## 2015-01-04 DIAGNOSIS — Y9389 Activity, other specified: Secondary | ICD-10-CM | POA: Insufficient documentation

## 2015-01-04 DIAGNOSIS — Y929 Unspecified place or not applicable: Secondary | ICD-10-CM | POA: Insufficient documentation

## 2015-01-04 DIAGNOSIS — Z8711 Personal history of peptic ulcer disease: Secondary | ICD-10-CM | POA: Insufficient documentation

## 2015-01-04 DIAGNOSIS — S39012A Strain of muscle, fascia and tendon of lower back, initial encounter: Secondary | ICD-10-CM

## 2015-01-04 DIAGNOSIS — Z720 Tobacco use: Secondary | ICD-10-CM | POA: Insufficient documentation

## 2015-01-04 DIAGNOSIS — Y998 Other external cause status: Secondary | ICD-10-CM | POA: Insufficient documentation

## 2015-01-04 MED ORDER — NAPROXEN 500 MG PO TABS: 500.0000 mg | ORAL_TABLET | Freq: Two times a day (BID) | ORAL | Status: DC

## 2015-01-04 MED ORDER — TRAMADOL HCL 50 MG PO TABS: 50.0000 mg | ORAL_TABLET | Freq: Four times a day (QID) | ORAL | Status: DC | PRN

## 2015-01-04 MED ORDER — CYCLOBENZAPRINE HCL 10 MG PO TABS: 10.0000 mg | ORAL_TABLET | Freq: Three times a day (TID) | ORAL | Status: DC | PRN

## 2015-01-04 NOTE — ED Provider Notes (Signed)
CSN: [id number]     Arrival date & time 01/04/15  0745 History   First MD Initiated Contact with Patient 01/04/15 [phone]     Chief Complaint  Patient presents with  . Back Pain     (Consider location/radiation/quality/duration/timing/severity/associated sxs/prior Treatment) HPI  John Howell is a 49 y.o. male who complains of an injury causing low back pain 2 day(s) ago. The pain is positional with bending or lifting, without radiation down the legs. Mechanism of injury: Back strain lifting box . Symptoms have been stable since that time. Prior history of back problems: no prior back problems. There is no numbness in the legs. Denies weakness, loss of bowel/bladder function or saddle anesthesia. Denies neck stiffness, headache, rash.  Denies fever or recent procedures to back.     Past Medical History  Diagnosis Date  . Diabetes mellitus without complication   . Peptic ulcer    History reviewed. No pertinent past surgical history. History reviewed. No pertinent family history. History  Substance Use Topics  . Smoking status: Current Every Day Smoker    Types: Cigarettes  . Smokeless tobacco: Not on file     Comment: 2  a day  . Alcohol Use: Yes    Review of Systems  Ten systems reviewed and are negative for acute change, except as noted in the HPI.    Allergies  Review of patient's allergies indicates no known allergies.  Home Medications   Prior to Admission medications   Medication Sig Start Date End Date Taking? Authorizing Provider  acetaminophen (TYLENOL) 325 MG tablet Take 650 mg by mouth every 6 (six) hours as needed for mild pain, fever or headache.    Historical Provider, MD  acetaminophen-codeine (TYLENOL #3) 300-30 MG per tablet Take 1 tablet by mouth every 8 (eight) hours as needed for severe pain. 08/23/13   Reyne Dumas, MD  amoxicillin-clavulanate (AUGMENTIN) 875-125 MG per tablet Take 1 tablet by mouth 2 (two) times daily. One po bid x 7 days 12/02/14    Mercedes Camprubi-Soms, PA-C  cyclobenzaprine (FLEXERIL) 10 MG tablet Take 1 tablet (10 mg total) by mouth 3 (three) times daily as needed for muscle spasms (and or back pain). 08/23/13   Reyne Dumas, MD  glucose blood test strip Use as instructed Patient not taking: Reported on 08/25/2014 08/23/13   Reyne Dumas, MD  glucose monitoring kit (FREESTYLE) monitoring kit 1 each by Does not apply route as needed for other. Patient not taking: Reported on 08/25/2014 08/23/13   Reyne Dumas, MD  HYDROcodone-acetaminophen (NORCO/VICODIN) 5-325 MG per tablet Take 1 tablet by mouth every 6 (six) hours as needed. 08/25/14   [name], MD  ibuprofen (ADVIL,MOTRIN) 200 MG tablet Take 400 mg by mouth every 6 (six) hours as needed for fever, headache or mild pain.    Historical Provider, MD  insulin NPH-regular Human (NOVOLIN 70/30) (70-30) 100 UNIT/ML injection 25 units in the morning and 25 units in the evening Patient not taking: Reported on 08/25/2014 08/23/13   Reyne Dumas, MD  lisinopril (PRINIVIL,ZESTRIL) 5 MG tablet Take 5 mg by mouth daily.    Historical Provider, MD  metFORMIN (GLUMETZA) 500 MG (MOD) 24 hr tablet Take 500 mg by mouth daily with breakfast.    Historical Provider, MD  pantoprazole (PROTONIX) 20 MG tablet Take 1 tablet (20 mg total) by mouth daily. 08/25/14   [name], MD  sucralfate (CARAFATE) 1 G tablet Take 1 tablet (1 g total) by mouth 4 (four) times  daily -  with meals and at bedtime. 09/14/14   Janne Napoleon, NP   BP 123/57 mmHg  Pulse 83  Temp(Src) 98.6 F (37 C) (Oral)  Resp 16  SpO2 100% Physical Exam  Constitutional: He is oriented to person, place, and time. He appears well-developed and well-nourished.  Patient appears to be in mild to moderate pain, antalgic gait noted.   HENT:  Head: Normocephalic and atraumatic.  Eyes: Conjunctivae are normal.  Neck: Normal range of motion.  Cardiovascular: Normal rate and regular rhythm.   Pulmonary/Chest: Effort normal.   Abdominal: Soft. He exhibits no distension.  Musculoskeletal: He exhibits no edema.  Lumbosacral spine area reveals no local tenderness or mass. Painful and reduced LS ROM noted. Straight leg raise is negative at 90 degrees on bilateral. DTR's, motor strength and sensation normal, including heel and toe gait.  Peripheral pulses are palpable.      Neurological: He is oriented to person, place, and time.  Skin: Skin is warm. No rash noted.  Nursing note and vitals reviewed.   ED Course  Procedures (including critical care time) Labs Review Labs Reviewed - No data to display  Imaging Review No results found.   EKG Interpretation None      MDM   Final diagnoses:  None    For acute pain, rest, intermittent application of cold packs (later, may switch to heat, but do not sleep on heating pad), analgesics and muscle relaxants are recommended. Discussed longer term treatment plan of prn NSAID's and discussed a home back care exercise program with flexion exercise routine. Proper lifting with avoidance of heavy lifting discussed. Consider Physical Therapy and XRay studies if not improving.   Margarita Mail, PA-C 01/04/15 [place], MD 01/04/15 [phone]

## 2015-01-04 NOTE — ED Notes (Signed)
Declined W/C at D/C and was escorted to lobby by RN. 

## 2015-01-04 NOTE — Discharge Instructions (Signed)
SEEK IMMEDIATE MEDICAL ATTENTION IF: New numbness, tingling, weakness, or problem with the use of your arms or legs.  Severe back pain not relieved with medications.  Change in bowel or bladder control.  Increasing pain in any areas of the body (such as chest or abdominal pain).  Shortness of breath, dizziness or fainting.  Nausea (feeling sick to your stomach), vomiting, fever, or sweats.  I suggest using LIDOCAINE 4% patches. Back Pain, Adult Low back pain is very common. About 1 in 5 people have back pain.The cause of low back pain is rarely dangerous. The pain often gets better over time.About half of people with a sudden onset of back pain feel better in just 2 weeks. About 8 in 10 people feel better by 6 weeks.  CAUSES Some common causes of back pain include:  Strain of the muscles or ligaments supporting the spine.  Wear and tear (degeneration) of the spinal discs.  Arthritis.  Direct injury to the back. DIAGNOSIS Most of the time, the direct cause of low back pain is not known.However, back pain can be treated effectively even when the exact cause of the pain is unknown.Answering your caregiver's questions about your overall health and symptoms is one of the most accurate ways to make sure the cause of your pain is not dangerous. If your caregiver needs more information, he or she may order lab work or imaging tests (X-rays or MRIs).However, even if imaging tests show changes in your back, this usually does not require surgery. HOME CARE INSTRUCTIONS For many people, back pain returns.Since low back pain is rarely dangerous, it is often a condition that people can learn to [facility] their own.   Remain active. It is stressful on the back to sit or stand in one place. Do not sit, drive, or stand in one place for more than 30 minutes at a time. Take short walks on level surfaces as soon as pain allows.Try to increase the length of time you walk each day.  Do not stay in  bed.Resting more than 1 or 2 days can delay your recovery.  Do not avoid exercise or work.Your body is made to move.It is not dangerous to be active, even though your back may hurt.Your back will likely heal faster if you return to being active before your pain is gone.  Pay attention to your body when you bend and lift. Many people have less discomfortwhen lifting if they bend their knees, keep the load close to their bodies,and avoid twisting. Often, the most comfortable positions are those that put less stress on your recovering back.  Find a comfortable position to sleep. Use a firm mattress and lie on your side with your knees slightly bent. If you lie on your back, put a pillow under your knees.  Only take over-the-counter or prescription medicines as directed by your caregiver. Over-the-counter medicines to reduce pain and inflammation are often the most helpful.Your caregiver may prescribe muscle relaxant drugs.These medicines help dull your pain so you can more quickly return to your normal activities and healthy exercise.  Put ice on the injured area.  Put ice in a plastic bag.  Place a towel between your skin and the bag.  Leave the ice on for 15-20 minutes, 03-04 times a day for the first 2 to 3 days. After that, ice and heat may be alternated to reduce pain and spasms.  Ask your caregiver about trying back exercises and gentle massage. This may be of some benefit.  Avoid feeling anxious or stressed.Stress increases muscle tension and can worsen back pain.It is important to recognize when you are anxious or stressed and learn ways to manage it.Exercise is a great option. SEEK MEDICAL CARE IF:  You have pain that is not relieved with rest or medicine.  You have pain that does not improve in 1 week.  You have new symptoms.  You are generally not feeling well. SEEK IMMEDIATE MEDICAL CARE IF:   You have pain that radiates from your back into your legs.  You  develop new bowel or bladder control problems.  You have unusual weakness or numbness in your arms or legs.  You develop nausea or vomiting.  You develop abdominal pain.  You feel faint. Document Released: 05/22/2005 Document Revised: 11/21/2011 Document Reviewed: 09/23/2013 [facility] Patient Information 2015 [place], [place]. This information is not intended to replace advice given to you by your health care provider. Make sure you discuss any questions you have with your health care provider.

## 2015-01-04 NOTE — ED Notes (Signed)
Pt reports his back started to hurt at work on [employer]. After lifting a heavy object.

## 2015-01-11 ENCOUNTER — Encounter: Payer: Self-pay | Admitting: *Deleted

## 2015-01-11 ENCOUNTER — Ambulatory Visit: Payer: Self-pay | Admitting: Cardiovascular Disease

## 2015-04-15 ENCOUNTER — Emergency Department (INDEPENDENT_AMBULATORY_CARE_PROVIDER_SITE_OTHER)
Admission: EM | Admit: 2015-04-15 | Discharge: 2015-04-15 | Disposition: A | Discharge status: Home / Self Care | Payer: Self-pay | Source: Home / Self Care | Attending: Family Medicine | Admitting: Family Medicine

## 2015-04-15 ENCOUNTER — Encounter (HOSPITAL_COMMUNITY): Payer: Self-pay | Admitting: Emergency Medicine

## 2015-04-15 DIAGNOSIS — S39012D Strain of muscle, fascia and tendon of lower back, subsequent encounter: Secondary | ICD-10-CM

## 2015-04-15 HISTORY — DX: Essential (primary) hypertension: I10

## 2015-04-15 MED ORDER — CYCLOBENZAPRINE HCL 10 MG PO TABS: 10.0000 mg | ORAL_TABLET | Freq: Three times a day (TID) | ORAL | Status: DC | PRN

## 2015-04-15 MED ORDER — KETOROLAC TROMETHAMINE 60 MG/2ML IM SOLN
INTRAMUSCULAR | Status: AC
  Filled 2015-04-15: qty 2

## 2015-04-15 MED ORDER — KETOROLAC TROMETHAMINE 60 MG/2ML IM SOLN
60.0000 mg | Freq: Once | INTRAMUSCULAR | Status: AC
  Administered 2015-04-15: 60 mg via INTRAMUSCULAR

## 2015-04-15 MED ORDER — NAPROXEN 500 MG PO TABS: 500.0000 mg | ORAL_TABLET | Freq: Two times a day (BID) | ORAL | Status: DC

## 2015-04-15 NOTE — Discharge Instructions (Signed)
It was a pleasure to see you today.   For your back strain and spasm, I am prescribing the following:  1. Cyclobenzaprine 10mg  tablets, take 1 tablet by mouth every 8 hours.  This medicine may make you drowsy, do not take if you will be driving or need to concentrate. Do not take with alcohol.  2. Naproxen 500mg  tablets, take 1 tablet by mouth twice daily with food.   Use a heating pad periodically on the low back.  Gentle range-of-motion stretches to keep your back from becoming stiff.   Follow up with your primary doctor if you are feeling that it is not improving. If you have numbness or weakness in your legs, or if you have worsening pain, or if you lose control of your bowel or bladder, please return or go to the emergency room.

## 2015-04-15 NOTE — ED Provider Notes (Signed)
CSN: [id number]     Arrival date & time 04/15/15  1311 History   First MD Initiated Contact with Patient 04/15/15 1459     Chief Complaint  Patient presents with  . Back Pain   (Consider location/radiation/quality/duration/timing/severity/associated sxs/prior Treatment) Patient is a 49 y.o. male presenting with back pain. The history is provided by the patient. No language interpreter was used.  Back Pain Associated symptoms: no fever, no numbness and no weakness   Pt with prior history low back pain, presents with 2 days of low back spasms and pain that are similar to prior episodes which have resolved with muscle relaxant. He lifts heavy objects for work at [employer] on [employer].  No discrete episode of trauma, no fall, or other injury. Pain radiates slightly down L buttock.  No associated weakness, bowel/bladder incontinence, saddle anesthesia.   He has taken ibuprofen and tylenol without relief. Used heating pad with minimal temporary relief.  Past Medical History  Diagnosis Date  . Diabetes mellitus without complication ([place])   . Peptic ulcer   . Hypertension    History reviewed. No pertinent past surgical history. History reviewed. No pertinent family history. Social History  Substance Use Topics  . Smoking status: Current Every Day Smoker    Types: Cigarettes  . Smokeless tobacco: None     Comment: 2  a day  . Alcohol Use: 25.2 oz/week    42 Cans of beer per week     Comment: 6 pack a day    Review of Systems  Constitutional: Negative for fever and chills.  Musculoskeletal: Positive for back pain.  Neurological: Negative for dizziness, syncope, weakness and numbness.    Allergies  Review of patient's allergies indicates no known allergies.  Home Medications   Prior to Admission medications   Medication Sig Start Date End Date Taking? Authorizing Provider  acetaminophen (TYLENOL) 325 MG tablet Take 650 mg by mouth every 6 (six) hours as needed for mild pain,  fever or headache.   Yes Historical Provider, MD  lisinopril (PRINIVIL,ZESTRIL) 5 MG tablet Take 5 mg by mouth daily.   Yes Historical Provider, MD  metFORMIN (GLUMETZA) 500 MG (MOD) 24 hr tablet Take 500 mg by mouth daily with breakfast.   Yes Historical Provider, MD  acetaminophen-codeine (TYLENOL #3) 300-30 MG per tablet Take 1 tablet by mouth every 8 (eight) hours as needed for severe pain. 08/23/13   Reyne Dumas, MD  amoxicillin-clavulanate (AUGMENTIN) 875-125 MG per tablet Take 1 tablet by mouth 2 (two) times daily. One po bid x 7 days 12/02/14   Mercedes Camprubi-Soms, PA-C  cyclobenzaprine (FLEXERIL) 10 MG tablet Take 1 tablet (10 mg total) by mouth 3 (three) times daily as needed for muscle spasms (and or back pain). 01/04/15   Margarita Mail, PA-C  glucose blood test strip Use as instructed Patient not taking: Reported on 08/25/2014 08/23/13   Reyne Dumas, MD  glucose monitoring kit (FREESTYLE) monitoring kit 1 each by Does not apply route as needed for other. Patient not taking: Reported on 08/25/2014 08/23/13   Reyne Dumas, MD  ibuprofen (ADVIL,MOTRIN) 200 MG tablet Take 400 mg by mouth every 6 (six) hours as needed for fever, headache or mild pain.    Historical Provider, MD  insulin NPH-regular Human (NOVOLIN 70/30) (70-30) 100 UNIT/ML injection 25 units in the morning and 25 units in the evening Patient not taking: Reported on 08/25/2014 08/23/13   Reyne Dumas, MD  naproxen (NAPROSYN) 500 MG tablet Take 1  tablet (500 mg total) by mouth 2 (two) times daily. 01/04/15   Margarita Mail, PA-C  pantoprazole (PROTONIX) 20 MG tablet Take 1 tablet (20 mg total) by mouth daily. 08/25/14   [name], MD  sucralfate (CARAFATE) 1 G tablet Take 1 tablet (1 g total) by mouth 4 (four) times daily -  with meals and at bedtime. 09/14/14   Janne Napoleon, NP  traMADol (ULTRAM) 50 MG tablet Take 1 tablet (50 mg total) by mouth every 6 (six) hours as needed. 01/04/15   Margarita Mail, PA-C   Meds Ordered and  Administered this Visit  Medications - No data to display  BP 130/84 mmHg  Pulse 82  Temp(Src) 98.8 F (37.1 C) (Oral)  Resp 16  SpO2 100% No data found.   Physical Exam  Constitutional: He appears well-developed and well-nourished. No distress.  Musculoskeletal:  Tight paraspinous mm in lumbosacral region bilaterally. No point tenderness over vertebral processes.   Neurological:  Able to walk on toes and heels.  Patellar reflexes 1+ symmetrically.   Strength with hip flexion, knee flexion, dorsiflexion/plantarflexion, all symmetric and full.   SLR bilat without associated limitation by pain (gets over 60 degrees in L leg before mild discomfort== further on R without pain).   Sensation in both feet is grossly intact and symmetric. Palpable dp pulses bilaterally. No edema in legs  Skin: He is diaphoretic.    ED Course  Procedures (including critical care time)  Labs Review Labs Reviewed - No data to display  Imaging Review No results found.   Visual Acuity Review  Right Eye Distance:   Left Eye Distance:   Bilateral Distance:    Right Eye Near:   Left Eye Near:    Bilateral Near:         MDM   1. Lumbar strain, subsequent encounter    Lumbar strain; Rx NSAID and muscle relaxant for next 2-3 days, heat pad. Note for work for 2 days. To see his primary doctor (he lists [facility], our system says [facility]) if worsening or return to ED or [facility] for red flag symptoms.     Willeen Niece, MD 04/15/15 [phone]

## 2015-04-15 NOTE — ED Notes (Signed)
Pt is suffering from lower back pain starting yesterday.  He thinks it may be from the lifting he has been doing at work.  Pt was on a muscle relaxer in the past and is hoping that will help him today.

## 2015-06-05 ENCOUNTER — Emergency Department (HOSPITAL_COMMUNITY)
Admission: EM | Admit: 2015-06-05 | Discharge: 2015-06-05 | Disposition: A | Discharge status: Home / Self Care | Payer: Self-pay | Attending: Emergency Medicine | Admitting: Emergency Medicine

## 2015-06-05 ENCOUNTER — Encounter (HOSPITAL_COMMUNITY): Payer: Self-pay | Admitting: Emergency Medicine

## 2015-06-05 DIAGNOSIS — Z79899 Other long term (current) drug therapy: Secondary | ICD-10-CM | POA: Insufficient documentation

## 2015-06-05 DIAGNOSIS — F17210 Nicotine dependence, cigarettes, uncomplicated: Secondary | ICD-10-CM | POA: Insufficient documentation

## 2015-06-05 DIAGNOSIS — R1013 Epigastric pain: Secondary | ICD-10-CM | POA: Insufficient documentation

## 2015-06-05 DIAGNOSIS — E119 Type 2 diabetes mellitus without complications: Secondary | ICD-10-CM | POA: Insufficient documentation

## 2015-06-05 DIAGNOSIS — I10 Essential (primary) hypertension: Secondary | ICD-10-CM | POA: Insufficient documentation

## 2015-06-05 DIAGNOSIS — Z8711 Personal history of peptic ulcer disease: Secondary | ICD-10-CM | POA: Insufficient documentation

## 2015-06-05 DIAGNOSIS — Z791 Long term (current) use of non-steroidal anti-inflammatories (NSAID): Secondary | ICD-10-CM | POA: Insufficient documentation

## 2015-06-05 LAB — COMPREHENSIVE METABOLIC PANEL
ALT: 38 U/L (ref 17–63)
AST: 39 U/L (ref 15–41)
Albumin: 4.1 g/dL (ref 3.5–5.0)
Alkaline Phosphatase: 52 U/L (ref 38–126)
Anion gap: 11 (ref 5–15)
BUN: 15 mg/dL (ref 6–20)
CO2: 24 mmol/L (ref 22–32)
Calcium: 9.5 mg/dL (ref 8.9–10.3)
Chloride: 105 mmol/L (ref 101–111)
Creatinine, Ser: 0.9 mg/dL (ref 0.61–1.24)
GFR calc Af Amer: >60 mL/min (ref >60)
GFR calc non Af Amer: >60 mL/min (ref >60)
Glucose, Bld: 119 mg/dL — ABNORMAL HIGH (ref 65–99)
Potassium: 4.4 mmol/L (ref 3.5–5.1)
Sodium: 140 mmol/L (ref 135–145)
Total Bilirubin: 0.9 mg/dL (ref 0.3–1.2)
Total Protein: 7.1 g/dL (ref 6.5–8.1)

## 2015-06-05 LAB — CBC WITH DIFFERENTIAL/PLATELET
Basophils Absolute: 0 10*3/uL (ref 0.0–0.1)
Basophils Relative: 0 %
Eosinophils Absolute: 0.2 10*3/uL (ref 0.0–0.7)
Eosinophils Relative: 2 %
HCT: 44.1 % (ref 39.0–52.0)
Hemoglobin: 15.5 g/dL (ref 13.0–17.0)
Lymphocytes Relative: 26 %
Lymphs Abs: 2.1 10*3/uL (ref 0.7–4.0)
MCH: 31.4 pg (ref 26.0–34.0)
MCHC: 35.1 g/dL (ref 30.0–36.0)
MCV: 89.3 fL (ref 78.0–100.0)
Monocytes Absolute: 0.6 10*3/uL (ref 0.1–1.0)
Monocytes Relative: 7 %
Neutro Abs: 5.1 10*3/uL (ref 1.7–7.7)
Neutrophils Relative %: 65 %
Platelets: 248 10*3/uL (ref 150–400)
RBC: 4.94 MIL/uL (ref 4.22–5.81)
RDW: 12.5 % (ref 11.5–15.5)
WBC: 7.9 10*3/uL (ref 4.0–10.5)

## 2015-06-05 LAB — LIPASE, BLOOD: Lipase: 25 U/L (ref 11–51)

## 2015-06-05 MED ORDER — GI COCKTAIL ~~LOC~~
30.0000 mL | Freq: Once | ORAL | Status: AC
  Administered 2015-06-05: 30 mL via ORAL
  Filled 2015-06-05: qty 30

## 2015-06-05 MED ORDER — HYDROMORPHONE HCL 1 MG/ML IJ SOLN
1.0000 mg | Freq: Once | INTRAMUSCULAR | Status: AC
Start: 2015-06-05 — End: 2015-06-05
  Administered 2015-06-05: 1 mg via INTRAVENOUS
  Filled 2015-06-05: qty 1

## 2015-06-05 MED ORDER — FAMOTIDINE IN NACL 20-0.9 MG/50ML-% IV SOLN
20.0000 mg | Freq: Once | INTRAVENOUS | Status: AC
  Administered 2015-06-05: 20 mg via INTRAVENOUS
  Filled 2015-06-05: qty 50

## 2015-06-05 MED ORDER — PANTOPRAZOLE SODIUM 20 MG PO TBEC: 20.0000 mg | DELAYED_RELEASE_TABLET | Freq: Two times a day (BID) | ORAL | Status: DC

## 2015-06-05 MED ORDER — PANTOPRAZOLE SODIUM 40 MG IV SOLR
40.0000 mg | Freq: Once | INTRAVENOUS | Status: AC
  Administered 2015-06-05: 40 mg via INTRAVENOUS
  Filled 2015-06-05: qty 40

## 2015-06-05 MED ORDER — FAMOTIDINE 20 MG PO TABS: 20.0000 mg | ORAL_TABLET | Freq: Two times a day (BID) | ORAL | Status: DC

## 2015-06-05 MED ORDER — SODIUM CHLORIDE 0.9 % IV BOLUS (SEPSIS)
1000.0000 mL | Freq: Once | INTRAVENOUS | Status: AC
  Administered 2015-06-05: 1000 mL via INTRAVENOUS

## 2015-06-05 NOTE — ED Notes (Signed)
Pt. Stated, I've had pain my stomach that started yesterday. I have stomach ulcers.

## 2015-06-18 NOTE — ED Provider Notes (Signed)
CSN: [id number]     Arrival date & time 06/05/15  1151 History   First MD Initiated Contact with Patient 06/05/15 1305     Chief Complaint  Patient presents with  . Abdominal Pain     (Consider location/radiation/quality/duration/timing/severity/associated sxs/prior Treatment) HPI   50 year old male with abdominal pain. Epigastric. Gradual onset yesterday and has been persistent since then. No appreciable exacerbating relieving factors. Deep, achy sensation. Feels bloated/full. No nausea or vomiting. No respiratory complaints. No cough. No fevers or chills. Patient has a past history of "stomach ulcers." He does not regularly take any suppressive medication. Occasionally uses NSAIDs but not on a regular basis. He does drink alcohol almost daily.  Past Medical History  Diagnosis Date  . Diabetes mellitus without complication ([place])   . Peptic ulcer   . Hypertension    History reviewed. No pertinent past surgical history. No family history on file. Social History  Substance Use Topics  . Smoking status: Current Every Day Smoker    Types: Cigarettes  . Smokeless tobacco: None     Comment: 2  a day  . Alcohol Use: 25.2 oz/week    42 Cans of beer per week     Comment: 6 pack a day    Review of Systems  All systems reviewed and negative, other than as noted in HPI.   Allergies  Review of patient's allergies indicates no known allergies.  Home Medications   Prior to Admission medications   Medication Sig Start Date End Date Taking? Authorizing Provider  acetaminophen (TYLENOL) 325 MG tablet Take 650 mg by mouth every 6 (six) hours as needed for mild pain, fever or headache.   Yes Historical Provider, MD  cyclobenzaprine (FLEXERIL) 10 MG tablet Take 1 tablet (10 mg total) by mouth 3 (three) times daily as needed for muscle spasms (and or back pain). 04/15/15  Yes Willeen Niece, MD  glucose blood test strip Use as instructed 08/23/13  Yes Reyne Dumas, MD  glucose monitoring kit  (FREESTYLE) monitoring kit 1 each by Does not apply route as needed for other. 08/23/13  Yes Reyne Dumas, MD  ibuprofen (ADVIL,MOTRIN) 200 MG tablet Take 400 mg by mouth every 6 (six) hours as needed for fever, headache or mild pain.   Yes Historical Provider, MD  lisinopril (PRINIVIL,ZESTRIL) 5 MG tablet Take 5 mg by mouth daily.   Yes Historical Provider, MD  metFORMIN (GLUMETZA) 500 MG (MOD) 24 hr tablet Take 500 mg by mouth daily with breakfast.   Yes Historical Provider, MD  naproxen (NAPROSYN) 500 MG tablet Take 1 tablet (500 mg total) by mouth 2 (two) times daily. 04/15/15  Yes Willeen Niece, MD  traMADol (ULTRAM) 50 MG tablet Take 1 tablet (50 mg total) by mouth every 6 (six) hours as needed. 01/04/15  Yes Margarita Mail, PA-C  acetaminophen-codeine (TYLENOL #3) 300-30 MG per tablet Take 1 tablet by mouth every 8 (eight) hours as needed for severe pain. 08/23/13   Reyne Dumas, MD  famotidine (PEPCID) 20 MG tablet Take 1 tablet (20 mg total) by mouth 2 (two) times daily. 06/05/15   Virgel Manifold, MD  insulin NPH-regular Human (NOVOLIN 70/30) (70-30) 100 UNIT/ML injection 25 units in the morning and 25 units in the evening Patient not taking: Reported on 08/25/2014 08/23/13   Reyne Dumas, MD  pantoprazole (PROTONIX) 20 MG tablet Take 1 tablet (20 mg total) by mouth 2 (two) times daily. 06/05/15   Virgel Manifold, MD   BP 119/80 mmHg  Pulse 78  Temp(Src) 98.7 F (37.1 C) (Oral)  Resp 16  Ht 5' 5.5" (1.664 m)  Wt 158 lb (71.668 kg)  BMI 25.88 kg/m2  SpO2 100% Physical Exam  Constitutional: He appears well-developed and well-nourished. No distress.  HENT:  Head: Normocephalic and atraumatic.  Eyes: Conjunctivae are normal. Right eye exhibits no discharge. Left eye exhibits no discharge.  Neck: Neck supple.  Cardiovascular: Normal rate, regular rhythm and normal heart sounds.  Exam reveals no gallop and no friction rub.   No murmur heard. Pulmonary/Chest: Effort normal and breath sounds  normal. No respiratory distress.  Abdominal: Soft. He exhibits no distension. There is tenderness.  Mild epigastric tenderness without rebound or guarding. No distention  Musculoskeletal: He exhibits no edema or tenderness.  Neurological: He is alert.  Skin: Skin is warm and dry.  Psychiatric: He has a normal mood and affect. His behavior is normal. Thought content normal.  Nursing note and vitals reviewed.   ED Course  Procedures (including critical care time) Labs Review Labs Reviewed  COMPREHENSIVE METABOLIC PANEL - Abnormal; Notable for the following:    Glucose, Bld 119 (*)    All other components within normal limits  LIPASE, BLOOD  CBC WITH DIFFERENTIAL/PLATELET    Imaging Review No results found. I have personally reviewed and evaluated these images and lab results as part of my medical decision-making.   EKG Interpretation None      MDM   Final diagnoses:  Epigastric pain    51 year old male with epigastric pain. He reports a past history of peptic ulcer disease. Suspect his symptoms today may be secondary to that. His abdominal exam is fairly benign. Distal minimal epigastric tenderness without peritoneal signs. Patient does drink heavily on a daily basis. His lipase is normal. Advised to moderate his alcohol intake. Will place on a PPI and H2 blocker empirically. Low suspicion for emergent process such as atypical ACS.    Virgel Manifold, MD 06/18/15 [phone]

## 2015-07-18 ENCOUNTER — Encounter (HOSPITAL_COMMUNITY): Payer: Self-pay | Admitting: Emergency Medicine

## 2015-07-18 ENCOUNTER — Emergency Department (HOSPITAL_COMMUNITY)
Admission: EM | Admit: 2015-07-18 | Discharge: 2015-07-19 | Disposition: A | Discharge status: Home / Self Care | Payer: Self-pay | Attending: Emergency Medicine | Admitting: Emergency Medicine

## 2015-07-18 DIAGNOSIS — H5789 Other specified disorders of eye and adnexa: Secondary | ICD-10-CM

## 2015-07-18 DIAGNOSIS — F17210 Nicotine dependence, cigarettes, uncomplicated: Secondary | ICD-10-CM | POA: Insufficient documentation

## 2015-07-18 DIAGNOSIS — Z79899 Other long term (current) drug therapy: Secondary | ICD-10-CM | POA: Insufficient documentation

## 2015-07-18 DIAGNOSIS — H578 Other specified disorders of eye and adnexa: Secondary | ICD-10-CM | POA: Insufficient documentation

## 2015-07-18 DIAGNOSIS — Z7984 Long term (current) use of oral hypoglycemic drugs: Secondary | ICD-10-CM | POA: Insufficient documentation

## 2015-07-18 DIAGNOSIS — Z8711 Personal history of peptic ulcer disease: Secondary | ICD-10-CM | POA: Insufficient documentation

## 2015-07-18 DIAGNOSIS — E119 Type 2 diabetes mellitus without complications: Secondary | ICD-10-CM | POA: Insufficient documentation

## 2015-07-18 DIAGNOSIS — H5711 Ocular pain, right eye: Secondary | ICD-10-CM | POA: Insufficient documentation

## 2015-07-18 DIAGNOSIS — I10 Essential (primary) hypertension: Secondary | ICD-10-CM | POA: Insufficient documentation

## 2015-07-18 DIAGNOSIS — Z791 Long term (current) use of non-steroidal anti-inflammatories (NSAID): Secondary | ICD-10-CM | POA: Insufficient documentation

## 2015-07-18 NOTE — ED Notes (Signed)
Pt states he was washing the dishes and had a diluted mixture of soap and bleach and water that splashed into R eye. Pt washed R eye out really well with warm water, states his eye is burning at this time, denies vision changes.

## 2015-07-18 NOTE — ED Provider Notes (Signed)
CSN: [id number]     Arrival date & time 07/18/15  2252 History  By signing my name below, I, John Howell, attest that this documentation has been prepared under the direction and in the presence of [place] Lions, PA-C. Electronically Signed: Randa Howell, ED Scribe. 07/18/2015. 11:55 PM.    Chief Complaint  Patient presents with  . Eye Injury   The history is provided by the patient. No language interpreter was used.   HPI Comments: John Howell is a 49 y.o. male who presents to the Emergency Department complaining of right eye injury onset tonight 1 hour PTA. Pt states that he was washing dishes with bleach water and some of the water splashed in his eye. He states he put about a cap full of bleach in an industrial sized sink. He presents with associated eye redness. Pt states that he flushed the eye with warm and cold water with no relief. He states that th eye is irritated and continues to burn. Pt denies wearing contact lenses. Denies visual disturbance or other related symptoms.    Past Medical History  Diagnosis Date  . Diabetes mellitus without complication ([place])   . Peptic ulcer   . Hypertension    History reviewed. No pertinent past surgical history. No family history on file. Social History  Substance Use Topics  . Smoking status: Current Every Day Smoker    Types: Cigarettes  . Smokeless tobacco: None     Comment: 2  a day  . Alcohol Use: 25.2 oz/week    42 Cans of beer per week     Comment: 6 pack a day    Review of Systems  Eyes: Positive for pain and redness. Negative for visual disturbance.   10 Systems reviewed and all are negative for acute change except as noted in the HPI.   Allergies  Review of patient's allergies indicates no known allergies.  Home Medications   Prior to Admission medications   Medication Sig Start Date End Date Taking? Authorizing Provider  acetaminophen (TYLENOL) 325 MG tablet Take 650 mg by mouth every 6 (six)  hours as needed for mild pain, fever or headache.    Historical Provider, MD  acetaminophen-codeine (TYLENOL #3) 300-30 MG per tablet Take 1 tablet by mouth every 8 (eight) hours as needed for severe pain. 08/23/13   Reyne Dumas, MD  cyclobenzaprine (FLEXERIL) 10 MG tablet Take 1 tablet (10 mg total) by mouth 3 (three) times daily as needed for muscle spasms (and or back pain). 04/15/15   Willeen Niece, MD  famotidine (PEPCID) 20 MG tablet Take 1 tablet (20 mg total) by mouth 2 (two) times daily. 06/05/15   Virgel Manifold, MD  glucose blood test strip Use as instructed 08/23/13   Reyne Dumas, MD  glucose monitoring kit (FREESTYLE) monitoring kit 1 each by Does not apply route as needed for other. 08/23/13   Reyne Dumas, MD  ibuprofen (ADVIL,MOTRIN) 200 MG tablet Take 400 mg by mouth every 6 (six) hours as needed for fever, headache or mild pain.    Historical Provider, MD  insulin NPH-regular Human (NOVOLIN 70/30) (70-30) 100 UNIT/ML injection 25 units in the morning and 25 units in the evening Patient not taking: Reported on 08/25/2014 08/23/13   Reyne Dumas, MD  lisinopril (PRINIVIL,ZESTRIL) 5 MG tablet Take 5 mg by mouth daily.    Historical Provider, MD  metFORMIN (GLUMETZA) 500 MG (MOD) 24 hr tablet Take 500 mg by mouth daily with breakfast.  Historical Provider, MD  naproxen (NAPROSYN) 500 MG tablet Take 1 tablet (500 mg total) by mouth 2 (two) times daily. 04/15/15   Willeen Niece, MD  pantoprazole (PROTONIX) 20 MG tablet Take 1 tablet (20 mg total) by mouth 2 (two) times daily. 06/05/15   Virgel Manifold, MD  traMADol (ULTRAM) 50 MG tablet Take 1 tablet (50 mg total) by mouth every 6 (six) hours as needed. 01/04/15   Abigail Harris, PA-C   BP 132/97 mmHg  Pulse 72  Temp(Src) 98.5 F (36.9 C) (Oral)  Resp 16  SpO2 99%   Physical Exam  Constitutional: He is oriented to person, place, and time. He appears well-developed and well-nourished. No distress.  HENT:  Head: Normocephalic and  atraumatic.  Eyes: EOM are normal. Pupils are equal, round, and reactive to light. Right eye exhibits no discharge. Left eye exhibits no discharge. No scleral icterus.  Slight conjunctival injection right eye  Neck: Neck supple. No tracheal deviation present.  Cardiovascular: Normal rate.   Pulmonary/Chest: Effort normal. No respiratory distress.  Musculoskeletal: Normal range of motion.  Neurological: He is alert and oriented to person, place, and time.  Skin: Skin is warm and dry.  Psychiatric: He has a normal mood and affect. His behavior is normal.  Nursing note and vitals reviewed.   ED Course  Procedures  DIAGNOSTIC STUDIES: Oxygen Saturation is 99% on RA, normal by my interpretation.    COORDINATION OF CARE: 11:11 PM-Discussed treatment plan with pt at bedside and pt agreed to plan.   MDM   Final diagnoses:  Eye irritation   Patient nontoxic appearing, VSS. Patient feeling improved after irrigation with 1L of NS and a soak. Encouraged patient to follow-up with ophthalmology for this issue and a yearly eye exam given history of diabetes. Patient may be safely discharged home with erythromycin ointment.   I personally performed the services described in this documentation, which was scribed in my presence. The recorded information has been reviewed and is accurate.   [place] Lions, PA-C 07/21/15 Ray, MD 07/23/15 [phone]

## 2015-07-19 MED ORDER — ERYTHROMYCIN 5 MG/GM OP OINT: TOPICAL_OINTMENT | OPHTHALMIC | Status: DC

## 2015-07-19 NOTE — Discharge Instructions (Signed)
John Howell,  Nice meeting you! Please follow-up with opthalmology. Return to the emergency department if you develop blurriness, visual changes, or your irritation does not improve. Feel better soon!  S. Lane Hacker, PA-C

## 2015-08-19 ENCOUNTER — Encounter (HOSPITAL_COMMUNITY): Payer: Self-pay

## 2015-08-19 ENCOUNTER — Emergency Department (HOSPITAL_COMMUNITY)
Admission: EM | Admit: 2015-08-19 | Discharge: 2015-08-19 | Disposition: A | Discharge status: Home / Self Care | Payer: Self-pay | Attending: Emergency Medicine | Admitting: Emergency Medicine

## 2015-08-19 DIAGNOSIS — Z794 Long term (current) use of insulin: Secondary | ICD-10-CM | POA: Insufficient documentation

## 2015-08-19 DIAGNOSIS — Z791 Long term (current) use of non-steroidal anti-inflammatories (NSAID): Secondary | ICD-10-CM | POA: Insufficient documentation

## 2015-08-19 DIAGNOSIS — R1013 Epigastric pain: Secondary | ICD-10-CM | POA: Insufficient documentation

## 2015-08-19 DIAGNOSIS — Z792 Long term (current) use of antibiotics: Secondary | ICD-10-CM | POA: Insufficient documentation

## 2015-08-19 DIAGNOSIS — K625 Hemorrhage of anus and rectum: Secondary | ICD-10-CM | POA: Insufficient documentation

## 2015-08-19 DIAGNOSIS — F17210 Nicotine dependence, cigarettes, uncomplicated: Secondary | ICD-10-CM | POA: Insufficient documentation

## 2015-08-19 DIAGNOSIS — Z79899 Other long term (current) drug therapy: Secondary | ICD-10-CM | POA: Insufficient documentation

## 2015-08-19 DIAGNOSIS — K219 Gastro-esophageal reflux disease without esophagitis: Secondary | ICD-10-CM | POA: Insufficient documentation

## 2015-08-19 DIAGNOSIS — R197 Diarrhea, unspecified: Secondary | ICD-10-CM | POA: Insufficient documentation

## 2015-08-19 DIAGNOSIS — I10 Essential (primary) hypertension: Secondary | ICD-10-CM | POA: Insufficient documentation

## 2015-08-19 DIAGNOSIS — E119 Type 2 diabetes mellitus without complications: Secondary | ICD-10-CM | POA: Insufficient documentation

## 2015-08-19 LAB — URINALYSIS, ROUTINE W REFLEX MICROSCOPIC
Bilirubin Urine: NEGATIVE
Glucose, UA: NEGATIVE mg/dL
Hgb urine dipstick: NEGATIVE
KETONES UR: NEGATIVE mg/dL
LEUKOCYTES UA: NEGATIVE
NITRITE: NEGATIVE
PROTEIN: NEGATIVE mg/dL
Specific Gravity, Urine: 1.011 (ref 1.005–1.030)
pH: 6 (ref 5.0–8.0)

## 2015-08-19 LAB — CBC
HCT: 43.3 % (ref 39.0–52.0)
Hemoglobin: 14.8 g/dL (ref 13.0–17.0)
MCH: 31.2 pg (ref 26.0–34.0)
MCHC: 34.2 g/dL (ref 30.0–36.0)
MCV: 91.2 fL (ref 78.0–100.0)
Platelets: 236 10*3/uL (ref 150–400)
RBC: 4.75 MIL/uL (ref 4.22–5.81)
RDW: 12.3 % (ref 11.5–15.5)
WBC: 8.8 10*3/uL (ref 4.0–10.5)

## 2015-08-19 LAB — COMPREHENSIVE METABOLIC PANEL
ALBUMIN: 4.1 g/dL (ref 3.5–5.0)
ALK PHOS: 52 U/L (ref 38–126)
ALT: 35 U/L (ref 17–63)
AST: 33 U/L (ref 15–41)
Anion gap: 13 (ref 5–15)
BUN: 8 mg/dL (ref 6–20)
CALCIUM: 9.8 mg/dL (ref 8.9–10.3)
CHLORIDE: 100 mmol/L — AB (ref 101–111)
CO2: 24 mmol/L (ref 22–32)
CREATININE: 0.71 mg/dL (ref 0.61–1.24)
GFR calc Af Amer: >60 mL/min (ref >60)
GFR calc non Af Amer: >60 mL/min (ref >60)
GLUCOSE: 244 mg/dL — AB (ref 65–99)
Potassium: 3.9 mmol/L (ref 3.5–5.1)
SODIUM: 137 mmol/L (ref 135–145)
Total Bilirubin: 0.5 mg/dL (ref 0.3–1.2)
Total Protein: 7.1 g/dL (ref 6.5–8.1)

## 2015-08-19 LAB — LIPASE, BLOOD: LIPASE: 38 U/L (ref 11–51)

## 2015-08-19 MED ORDER — OMEPRAZOLE 20 MG PO CPDR
20.0000 mg | DELAYED_RELEASE_CAPSULE | Freq: Every day | ORAL | Status: DC
Start: 2015-08-19 — End: 2018-10-15

## 2015-08-19 MED ORDER — SUCRALFATE 1 GM/10ML PO SUSP: 1.0000 g | Freq: Three times a day (TID) | ORAL | Status: DC

## 2015-08-19 MED ORDER — GI COCKTAIL ~~LOC~~
30.0000 mL | Freq: Once | ORAL | Status: AC
  Administered 2015-08-19: 30 mL via ORAL
  Filled 2015-08-19: qty 30

## 2015-08-19 NOTE — ED Notes (Signed)
Pt reports burning/aching abdominal pain to middle of abdomen; onset yesterday hx of peptic ulcers. He reports one episode of vomiting and diarrhea last night. Last time he ate was cream of broccoli today at 2330.

## 2015-08-19 NOTE — ED Provider Notes (Signed)
CSN: [id number]     Arrival date & time 08/19/15  1840 History  By signing my name below, I, Rayna Sexton, attest that this documentation has been prepared under the direction and in the presence of Montine Circle, PA-C. Electronically Signed: Rayna Sexton, ED Scribe. 08/19/2015. 9:41 PM.   Chief Complaint  Patient presents with  . Abdominal Pain   The history is provided by the patient. No language interpreter was used.   HPI Comments: John Howell is a 50 y.o. male with a PMHx including DM, HTN and peptic ulcers who presents to the Emergency Department complaining of constant, moderate, burning, central abd pain onset 1 day ago. Pt notes a PMHx of peptic ulcers which he was dx with last month and has been taking OTC antacids which typically provide short term relief. His pain worsens when lying down at night or when palpating the affected region. He is unsure of where he received his dx but does confirm this pain is similar to past peptic ulcer exacerbations. Pt notes 1x bloody stool 2 days ago that was bright red and intermittent diarrhea. Pt notes a hx of smoking. He denies any other associated symptoms at this time.    Past Medical History  Diagnosis Date  . Diabetes mellitus without complication ([place])   . Peptic ulcer   . Hypertension    History reviewed. No pertinent past surgical history. No family history on file. Social History  Substance Use Topics  . Smoking status: Current Every Day Smoker    Types: Cigarettes  . Smokeless tobacco: None     Comment: 2  a day  . Alcohol Use: 25.2 oz/week    42 Cans of beer per week     Comment: 6 pack a day    Review of Systems  Gastrointestinal: Positive for abdominal pain, diarrhea and anal bleeding. Negative for constipation.    Allergies  Review of patient's allergies indicates no known allergies.  Home Medications   Prior to Admission medications   Medication Sig Start Date End Date Taking? Authorizing Provider   acetaminophen (TYLENOL) 325 MG tablet Take 650 mg by mouth every 6 (six) hours as needed for mild pain, fever or headache.    Historical Provider, MD  acetaminophen-codeine (TYLENOL #3) 300-30 MG per tablet Take 1 tablet by mouth every 8 (eight) hours as needed for severe pain. 08/23/13   Reyne Dumas, MD  cyclobenzaprine (FLEXERIL) 10 MG tablet Take 1 tablet (10 mg total) by mouth 3 (three) times daily as needed for muscle spasms (and or back pain). 04/15/15   Willeen Niece, MD  erythromycin ophthalmic ointment Place a 1/2 inch ribbon of ointment into the lower eyelid. 07/19/15   [place] Lions, PA-C  famotidine (PEPCID) 20 MG tablet Take 1 tablet (20 mg total) by mouth 2 (two) times daily. 06/05/15   Virgel Manifold, MD  glucose blood test strip Use as instructed 08/23/13   Reyne Dumas, MD  glucose monitoring kit (FREESTYLE) monitoring kit 1 each by Does not apply route as needed for other. 08/23/13   Reyne Dumas, MD  ibuprofen (ADVIL,MOTRIN) 200 MG tablet Take 400 mg by mouth every 6 (six) hours as needed for fever, headache or mild pain.    Historical Provider, MD  insulin NPH-regular Human (NOVOLIN 70/30) (70-30) 100 UNIT/ML injection 25 units in the morning and 25 units in the evening Patient not taking: Reported on 08/25/2014 08/23/13   Reyne Dumas, MD  lisinopril (PRINIVIL,ZESTRIL) 5 MG tablet Take 5  mg by mouth daily.    Historical Provider, MD  metFORMIN (GLUMETZA) 500 MG (MOD) 24 hr tablet Take 500 mg by mouth daily with breakfast.    Historical Provider, MD  naproxen (NAPROSYN) 500 MG tablet Take 1 tablet (500 mg total) by mouth 2 (two) times daily. 04/15/15   Willeen Niece, MD  pantoprazole (PROTONIX) 20 MG tablet Take 1 tablet (20 mg total) by mouth 2 (two) times daily. 06/05/15   Virgel Manifold, MD  traMADol (ULTRAM) 50 MG tablet Take 1 tablet (50 mg total) by mouth every 6 (six) hours as needed. 01/04/15   Abigail Harris, PA-C   BP 135/99 mmHg  Pulse 76  Temp(Src) 98.2 F (36.8 C)  (Oral)  Resp 16  SpO2 100%    Physical Exam  Constitutional: He is oriented to person, place, and time. He appears well-developed and well-nourished.  HENT:  Head: Normocephalic and atraumatic.  Eyes: Conjunctivae and EOM are normal. Pupils are equal, round, and reactive to light. Right eye exhibits no discharge. Left eye exhibits no discharge. No scleral icterus.  Neck: Normal range of motion. Neck supple. No JVD present.  Cardiovascular: Normal rate, regular rhythm and normal heart sounds.  Exam reveals no gallop and no friction rub.   No murmur heard. Pulmonary/Chest: Effort normal and breath sounds normal. No respiratory distress. He has no wheezes. He has no rales. He exhibits no tenderness.  Abdominal: Soft. He exhibits no distension and no mass. There is tenderness. There is no rebound and no guarding.  Moderate epigastric abdominal tenderness, no other focal abdominal tenderness, no RLQ tenderness or pain at McBurney's point, no RUQ tenderness or Murphy's sign, no left-sided abdominal tenderness, no fluid wave, or signs of peritonitis   Musculoskeletal: Normal range of motion. He exhibits no edema or tenderness.  Neurological: He is alert and oriented to person, place, and time.  Skin: Skin is warm and dry.  Psychiatric: He has a normal mood and affect. His behavior is normal. Judgment and thought content normal.  Nursing note and vitals reviewed.   ED Course  Procedures  DIAGNOSTIC STUDIES: Oxygen Saturation is 100% on RA, normal by my interpretation.    COORDINATION OF CARE: 9:37 PM Discussed next steps with pt. He verbalized understanding and is agreeable with the plan.   Results for orders placed or performed during the hospital encounter of 08/19/15  Lipase, blood  Result Value Ref Range   Lipase 38 11 - 51 U/L  Comprehensive metabolic panel  Result Value Ref Range   Sodium 137 135 - 145 mmol/L   Potassium 3.9 3.5 - 5.1 mmol/L   Chloride 100 (L) 101 - 111 mmol/L    CO2 24 22 - 32 mmol/L   Glucose, Bld 244 (H) 65 - 99 mg/dL   BUN 8 6 - 20 mg/dL   Creatinine, Ser 0.71 0.61 - 1.24 mg/dL   Calcium 9.8 8.9 - 10.3 mg/dL   Total Protein 7.1 6.5 - 8.1 g/dL   Albumin 4.1 3.5 - 5.0 g/dL   AST 33 15 - 41 U/L   ALT 35 17 - 63 U/L   Alkaline Phosphatase 52 38 - 126 U/L   Total Bilirubin 0.5 0.3 - 1.2 mg/dL   GFR calc non Af Amer >60 >60 mL/min   GFR calc Af Amer >60 >60 mL/min   Anion gap 13 5 - 15  CBC  Result Value Ref Range   WBC 8.8 4.0 - 10.5 K/uL   RBC 4.75 4.22 -  5.81 MIL/uL   Hemoglobin 14.8 13.0 - 17.0 g/dL   HCT 43.3 39.0 - 52.0 %   MCV 91.2 78.0 - 100.0 fL   MCH 31.2 26.0 - 34.0 pg   MCHC 34.2 30.0 - 36.0 g/dL   RDW 12.3 11.5 - 15.5 %   Platelets 236 150 - 400 K/uL  Urinalysis, Routine w reflex microscopic (not at [facility])  Result Value Ref Range   Color, Urine YELLOW YELLOW   APPearance CLEAR CLEAR   Specific Gravity, Urine 1.011 1.005 - 1.030   pH 6.0 5.0 - 8.0   Glucose, UA NEGATIVE NEGATIVE mg/dL   Hgb urine dipstick NEGATIVE NEGATIVE   Bilirubin Urine NEGATIVE NEGATIVE   Ketones, ur NEGATIVE NEGATIVE mg/dL   Protein, ur NEGATIVE NEGATIVE mg/dL   Nitrite NEGATIVE NEGATIVE   Leukocytes, UA NEGATIVE NEGATIVE   No results found.   I have personally reviewed and evaluated these lab results as part of my medical decision-making.    MDM   Final diagnoses:  Epigastric abdominal pain    Patient with epigastric abdominal pain. He has been having the symptoms intermittently for a while now. He has a history of GERD and suspected peptic ulcer disease. States that he has tried OTC medications, but these are no longer working. Will check labs, and will reassess.  Lipase is normal at 38, CMP unremarkable, no elevated LFTs, doubt cholecystitis. Patient does not have any right upper quadrant abdominal tenderness. Will give patient GI cocktail, discharge with omeprazole and Carafate. Recommend gastroenterology follow-up. Also given  instructions regarding dietary recommendations for GERD and lifestyle modification. Patient understands and agrees to plan. He is stable and ready for discharge.  I personally performed the services described in this documentation, which was scribed in my presence. The recorded information has been reviewed and is accurate.      Montine Circle, PA-C 08/19/15 2213  Dorie Rank, MD 08/20/15 1240

## 2015-08-19 NOTE — ED Notes (Signed)
Patient able to ambulate independently  

## 2015-08-19 NOTE — Discharge Instructions (Signed)
Peptic Ulcer A peptic ulcer is a sore in the lining of your esophagus (esophageal ulcer), stomach (gastric ulcer), or in the first part of your small intestine (duodenal ulcer). The ulcer causes erosion into the deeper tissue. CAUSES  Normally, the lining of the stomach and the small intestine protects itself from the acid that digests food. The protective lining can be damaged by:  An infection caused by a bacterium called Helicobacter pylori (H. pylori).  Regular use of nonsteroidal anti-inflammatory drugs (NSAIDs), such as ibuprofen or aspirin.  Smoking tobacco. Other risk factors include being older than 50, drinking alcohol excessively, and having a family history of ulcer disease.  SYMPTOMS   Burning pain or gnawing in the area between the chest and the belly button.  Heartburn.  Nausea and vomiting.  Bloating. The pain can be worse on an empty stomach and at night. If the ulcer results in bleeding, it can cause:  Black, tarry stools.  Vomiting of bright red blood.  Vomiting of coffee-ground-looking materials. DIAGNOSIS  A diagnosis is usually made based upon your history and an exam. Other tests and procedures may be performed to find the cause of the ulcer. Finding a cause will help determine the best treatment. Tests and procedures may include:  Blood tests, stool tests, or breath tests to check for the bacterium H. pylori.  An upper gastrointestinal (GI) series of the esophagus, stomach, and small intestine.  An endoscopy to examine the esophagus, stomach, and small intestine.  A biopsy. TREATMENT  Treatment may include:  Eliminating the cause of the ulcer, such as smoking, NSAIDs, or alcohol.  Medicines to reduce the amount of acid in your digestive tract.  Antibiotic medicines if the ulcer is caused by the H. pylori bacterium.  An upper endoscopy to treat a bleeding ulcer.  Surgery if the bleeding is severe or if the ulcer created a hole somewhere in the  digestive system. HOME CARE INSTRUCTIONS   Avoid tobacco, alcohol, and caffeine. Smoking can increase the acid in the stomach, and continued smoking will impair the healing of ulcers.  Avoid foods and drinks that seem to cause discomfort or aggravate your ulcer.  Only take medicines as directed by your caregiver. Do not substitute over-the-counter medicines for prescription medicines without talking to your caregiver.  Keep any follow-up appointments and tests as directed. SEEK MEDICAL CARE IF:   Your do not improve within 7 days of starting treatment.  You have ongoing indigestion or heartburn. SEEK IMMEDIATE MEDICAL CARE IF:   You have sudden, sharp, or persistent abdominal pain.  You have bloody or dark black, tarry stools.  You vomit blood or vomit that looks like coffee grounds.  You become light-headed, weak, or feel faint.  You become sweaty or clammy. MAKE SURE YOU:   Understand these instructions.  Will watch your condition.  Will get help right away if you are not doing well or get worse.   This information is not intended to replace advice given to you by your health care provider. Make sure you discuss any questions you have with your health care provider.   Document Released: 05/19/2000 Document Revised: 06/12/2014 Document Reviewed: 12/20/2011 Elsevier Interactive Patient Education 2016 [employer]  Food Choices for Gastroesophageal Reflux Disease, Adult When you have gastroesophageal reflux disease (GERD), the foods you eat and your eating habits are very important. Choosing the right foods can help ease the discomfort of GERD. WHAT GENERAL GUIDELINES DO I NEED TO FOLLOW?  Choose fruits, vegetables,   whole grains, low-fat dairy products, and low-fat meat, fish, and poultry.  Limit fats such as oils, salad dressings, butter, nuts, and avocado.  Keep a food diary to identify foods that cause symptoms.  Avoid foods that cause reflux. These may be  different for different people.  Eat frequent small meals instead of three large meals each day.  Eat your meals slowly, in a relaxed setting.  Limit fried foods.  Cook foods using methods other than frying.  Avoid drinking alcohol.  Avoid drinking large amounts of liquids with your meals.  Avoid bending over or lying down until 2-3 hours after eating. WHAT FOODS ARE NOT RECOMMENDED? The following are some foods and drinks that may worsen your symptoms: Vegetables Tomatoes. Tomato juice. Tomato and spaghetti sauce. Chili peppers. Onion and garlic. Horseradish. Fruits Oranges, grapefruit, and lemon (fruit and juice). Meats High-fat meats, fish, and poultry. This includes hot dogs, ribs, ham, sausage, salami, and bacon. Dairy Whole milk and chocolate milk. Sour cream. Cream. Butter. Ice cream. Cream cheese.  Beverages Coffee and tea, with or without caffeine. Carbonated beverages or energy drinks. Condiments Hot sauce. Barbecue sauce.  Sweets/Desserts Chocolate and cocoa. Donuts. Peppermint and spearmint. Fats and Oils High-fat foods, including French fries and potato chips. Other Vinegar. Strong spices, such as black pepper, white pepper, red pepper, cayenne, curry powder, cloves, ginger, and chili powder. The items listed above may not be a complete list of foods and beverages to avoid. Contact your dietitian for more information.   This information is not intended to replace advice given to you by your health care provider. Make sure you discuss any questions you have with your health care provider.   Document Released: 05/22/2005 Document Revised: 06/12/2014 Document Reviewed: 03/26/2013 Elsevier Interactive Patient Education 2016 [employer]vier Inc.  

## 2017-12-13 ENCOUNTER — Emergency Department (HOSPITAL_COMMUNITY)
Admission: EM | Admit: 2017-12-13 | Discharge: 2017-12-13 | Disposition: A | Discharge status: Home / Self Care | Payer: Self-pay | Attending: Emergency Medicine | Admitting: Emergency Medicine

## 2017-12-13 ENCOUNTER — Encounter (HOSPITAL_COMMUNITY): Payer: Self-pay | Admitting: Emergency Medicine

## 2017-12-13 ENCOUNTER — Other Ambulatory Visit: Payer: Self-pay

## 2017-12-13 DIAGNOSIS — E119 Type 2 diabetes mellitus without complications: Secondary | ICD-10-CM | POA: Insufficient documentation

## 2017-12-13 DIAGNOSIS — Z794 Long term (current) use of insulin: Secondary | ICD-10-CM | POA: Insufficient documentation

## 2017-12-13 DIAGNOSIS — F17210 Nicotine dependence, cigarettes, uncomplicated: Secondary | ICD-10-CM | POA: Insufficient documentation

## 2017-12-13 DIAGNOSIS — Z79899 Other long term (current) drug therapy: Secondary | ICD-10-CM | POA: Insufficient documentation

## 2017-12-13 DIAGNOSIS — I10 Essential (primary) hypertension: Secondary | ICD-10-CM | POA: Insufficient documentation

## 2017-12-13 DIAGNOSIS — H6121 Impacted cerumen, right ear: Secondary | ICD-10-CM | POA: Insufficient documentation

## 2017-12-13 NOTE — ED Notes (Signed)
Patient verbalizes understanding of discharge instructions. Opportunity for questioning and answers were provided. Armband removed by staff, pt discharged from ED ambulatory.   

## 2017-12-13 NOTE — ED Notes (Signed)
ED Provider at bedside. 

## 2017-12-13 NOTE — ED Notes (Signed)
Ear irrigated by EDP.

## 2017-12-13 NOTE — ED Provider Notes (Signed)
[facility] EMERGENCY DEPARTMENT Provider Note   CSN: [id number] Arrival date & time: 12/13/17  1846     History   Chief Complaint Chief Complaint  Patient presents with  . Otalgia    HPI John Howell is a 52 y.o. male.  HPI   52 year old male presents today with complaints of right-sided ear fullness. Patient notes he was cleaning his ears with Q-tips and felt his ear gets stuffed up. He denies any significant pain.no infectious etiology  Past Medical History:  Diagnosis Date  . Diabetes mellitus without complication ([place])   . Hypertension   . Peptic ulcer     There are no active problems to display for this patient.   History reviewed. No pertinent surgical history.     Home Medications    Prior to Admission medications   Medication Sig Start Date End Date Taking? Authorizing Provider  acetaminophen (TYLENOL) 500 MG tablet Take 500-1,000 mg by mouth every 6 (six) hours as needed for mild pain, moderate pain, fever or headache.   Yes [provider]  cyclobenzaprine (FLEXERIL) 10 MG tablet Take 1 tablet (10 mg total) by mouth 3 (three) times daily as needed for muscle spasms (and or back pain). 04/15/15  Yes Willeen Niece, MD  famotidine (PEPCID) 20 MG tablet Take 1 tablet (20 mg total) by mouth 2 (two) times daily. Patient taking differently: Take 20 mg by mouth 2 (two) times daily as needed for heartburn or indigestion.  06/05/15  Yes Virgel Manifold, MD  ibuprofen (ADVIL,MOTRIN) 200 MG tablet Take 400 mg by mouth every 6 (six) hours as needed for fever, headache or mild pain.   Yes [provider]  metFORMIN (GLUMETZA) 500 MG (MOD) 24 hr tablet Take 500 mg by mouth daily with breakfast.   Yes [provider]  acetaminophen-codeine (TYLENOL #3) 300-30 MG per tablet Take 1 tablet by mouth every 8 (eight) hours as needed for severe pain. Patient not taking: Reported on 12/13/2017 08/23/13   Reyne Dumas, MD  erythromycin  ophthalmic ointment Place a 1/2 inch ribbon of ointment into the lower eyelid. Patient not taking: Reported on 12/13/2017 07/19/15   Laurens Lions, PA-C  glucose blood test strip Use as instructed Patient not taking: Reported on 12/13/2017 08/23/13   Reyne Dumas, MD  glucose monitoring kit (FREESTYLE) monitoring kit 1 each by Does not apply route as needed for other. Patient not taking: Reported on 12/13/2017 08/23/13   Reyne Dumas, MD  insulin NPH-regular Human (NOVOLIN 70/30) (70-30) 100 UNIT/ML injection 25 units in the morning and 25 units in the evening Patient not taking: Reported on 08/25/2014 08/23/13   Reyne Dumas, MD  naproxen (NAPROSYN) 500 MG tablet Take 1 tablet (500 mg total) by mouth 2 (two) times daily. Patient not taking: Reported on 12/13/2017 04/15/15   Willeen Niece, MD  omeprazole (PRILOSEC) 20 MG capsule Take 1 capsule (20 mg total) by mouth daily. Patient not taking: Reported on 12/13/2017 08/19/15   Montine Circle, PA-C  pantoprazole (PROTONIX) 20 MG tablet Take 1 tablet (20 mg total) by mouth 2 (two) times daily. Patient not taking: Reported on 12/13/2017 06/05/15   Virgel Manifold, MD  sucralfate (CARAFATE) 1 GM/10ML suspension Take 10 mLs (1 g total) by mouth 4 (four) times daily -  with meals and at bedtime. Patient not taking: Reported on 12/13/2017 08/19/15   Montine Circle, PA-C  traMADol (ULTRAM) 50 MG tablet Take 1 tablet (50 mg total) by mouth every  6 (six) hours as needed. Patient not taking: Reported on 12/13/2017 01/04/15   Margarita Mail, PA-C    Family History History reviewed. No pertinent family history.  Social History Social History   Tobacco Use  . Smoking status: Current Every Day Smoker    Types: Cigarettes  . Tobacco comment: 2  a day  Substance Use Topics  . Alcohol use: Yes    Alcohol/week: 25.2 oz    Types: 42 Cans of beer per week    Comment: 6 pack a day  . Drug use: No     Allergies   Patient has no known  allergies.   Review of Systems Review of Systems  All other systems reviewed and are negative.    Physical Exam Updated Vital Signs BP (!) 137/96 (BP Location: Right Arm)   Pulse 66   Temp 98.2 F (36.8 C) (Oral)   Resp 16   Ht _0  (1.854 m)   Wt 72.6 kg (160 lb)   SpO2 100%   BMI 21.11 kg/m   Physical Exam  Constitutional: He is oriented to person, place, and time. He appears well-developed and well-nourished.  HENT:  Head: Normocephalic and atraumatic.  Right external canal with  Cerumen impaction, left normal  Eyes: Pupils are equal, round, and reactive to light. Conjunctivae are normal. Right eye exhibits no discharge. Left eye exhibits no discharge. No scleral icterus.  Neck: Normal range of motion. No JVD present. No tracheal deviation present.  Pulmonary/Chest: Effort normal. No stridor.  Neurological: He is alert and oriented to person, place, and time. Coordination normal.  Psychiatric: He has a normal mood and affect. His behavior is normal. Judgment and thought content normal.  Nursing note and vitals reviewed.    ED Treatments / Results  Labs (all labs ordered are listed, but only abnormal results are displayed) Labs Reviewed - No data to display  EKG None  Radiology No results found.  Procedures .Ear Cerumen Removal Date/Time: 12/13/2017 10:09 PM Performed by: Okey Regal, PA-C Authorized by: Okey Regal, PA-C   Consent:    Consent obtained:  Verbal   Consent given by:  Patient   Risks discussed:  Bleeding, dizziness, incomplete removal, pain, TM perforation and infection   Alternatives discussed:  No treatment, delayed treatment, alternative treatment and observation Procedure details:    Location:  R ear   Procedure type: irrigation   Post-procedure details:    Hearing quality:  Improved   Patient tolerance of procedure:  Tolerated well, no immediate complications   (including critical care time)  Medications Ordered in  ED Medications - No data to display   Initial Impression / Assessment and Plan / ED Course  I have reviewed the triage vital signs and the nursing notes.  Pertinent labs & imaging results that were available during my care of the patient were reviewed by me and considered in my medical decision making (see chart for details).     Patient was cerumen impaction, disimpacted successful. Patient discharged with return precautions.  Final Clinical Impressions(s) / ED Diagnoses   Final diagnoses:  Impacted cerumen of right ear    ED Discharge Orders    None       Francee Gentile 12/13/17 [place], DO 12/13/17 2214

## 2017-12-13 NOTE — ED Triage Notes (Signed)
Pt stating his right ear feels "stuffy". No pain, just unable to hear out of it. States he was cleaning his ears today and thinks wax may have gotten stuck in it. VSS in triage, no other complaints.

## 2017-12-13 NOTE — Discharge Instructions (Addendum)
Please read attached information. If you experience any new or worsening signs or symptoms please return to the emergency room for evaluation. Please follow-up with your primary care provider or specialist as discussed.  °

## 2018-02-22 ENCOUNTER — Encounter (HOSPITAL_COMMUNITY): Payer: Self-pay

## 2018-10-15 ENCOUNTER — Encounter (HOSPITAL_COMMUNITY): Payer: Self-pay | Admitting: Emergency Medicine

## 2018-10-15 ENCOUNTER — Ambulatory Visit (HOSPITAL_COMMUNITY)
Admission: EM | Admit: 2018-10-15 | Discharge: 2018-10-15 | Disposition: A | Discharge status: Home / Self Care | Payer: Self-pay | Attending: Family Medicine | Admitting: Family Medicine

## 2018-10-15 ENCOUNTER — Other Ambulatory Visit: Payer: Self-pay

## 2018-10-15 DIAGNOSIS — M545 Low back pain, unspecified: Secondary | ICD-10-CM

## 2018-10-15 MED ORDER — KETOROLAC TROMETHAMINE 30 MG/ML IJ SOLN
30.0000 mg | Freq: Once | INTRAMUSCULAR | Status: AC
  Administered 2018-10-15: 12:00:00 30 mg via INTRAMUSCULAR

## 2018-10-15 MED ORDER — IBUPROFEN 800 MG PO TABS
800.0000 mg | ORAL_TABLET | Freq: Three times a day (TID) | ORAL | 0 refills | Status: DC
Start: 2018-10-15 — End: 2018-11-10

## 2018-10-15 MED ORDER — KETOROLAC TROMETHAMINE 30 MG/ML IJ SOLN
INTRAMUSCULAR | Status: AC
  Filled 2018-10-15: qty 1

## 2018-10-15 MED ORDER — CYCLOBENZAPRINE HCL 5 MG PO TABS: 5.0000 mg | ORAL_TABLET | Freq: Two times a day (BID) | ORAL | 0 refills | Status: DC | PRN

## 2018-10-15 NOTE — ED Provider Notes (Addendum)
[facility]    CSN: [id number] Arrival date & time: 10/15/18  1049     History   Chief Complaint Chief Complaint  Patient presents with  . Back Pain    HPI John Howell is a 53 y.o. male history of DM type II, hypertension, peptic ulcer disease presenting today for evaluation of back pain.  Patient has had back pain for the past year.  States that of recently his pain is worsened.  He has been taking Tylenol without relief.  Denies any recent injury, but does state that he is strained it in the past.  He denies numbness or tingling.  Has radiation towards his right hip, but denies radiation into legs.  Pain is on both sides.  Worse with standing.  Notes that he stands for 4 to 5 hours while at work which triggers his pain.  HPI  Past Medical History:  Diagnosis Date  . Diabetes mellitus without complication ([place])   . Hypertension   . Peptic ulcer     There are no active problems to display for this patient.   History reviewed. No pertinent surgical history.     Home Medications    Prior to Admission medications   Medication Sig Start Date End Date Taking? Authorizing Provider  acetaminophen (TYLENOL) 500 MG tablet Take 500-1,000 mg by mouth every 6 (six) hours as needed for mild pain, moderate pain, fever or headache.    [provider]  cyclobenzaprine (FLEXERIL) 5 MG tablet Take 1-2 tablets (5-10 mg total) by mouth 2 (two) times daily as needed for muscle spasms. 10/15/18   ,  C, PA-C  glucose blood test strip Use as instructed Patient not taking: Reported on 12/13/2017 08/23/13   Reyne Dumas, MD  glucose monitoring kit (FREESTYLE) monitoring kit 1 each by Does not apply route as needed for other. Patient not taking: Reported on 12/13/2017 08/23/13   Reyne Dumas, MD  ibuprofen (ADVIL) 800 MG tablet Take 1 tablet (800 mg total) by mouth 3 (three) times daily. 10/15/18   ,  C, PA-C  insulin NPH-regular Human (NOVOLIN 70/30)  (70-30) 100 UNIT/ML injection 25 units in the morning and 25 units in the evening Patient not taking: Reported on 08/25/2014 08/23/13   Reyne Dumas, MD  metFORMIN (GLUMETZA) 500 MG (MOD) 24 hr tablet Take 500 mg by mouth daily with breakfast.    [provider]    Family History No family history on file.  Social History Social History   Tobacco Use  . Smoking status: Current Every Day Smoker    Types: Cigarettes  . Tobacco comment: 2  a day  Substance Use Topics  . Alcohol use: Yes    Alcohol/week: 42.0 standard drinks    Types: 42 Cans of beer per week    Comment: 6 pack a day  . Drug use: No     Allergies   Patient has no known allergies.   Review of Systems Review of Systems  Constitutional: Negative for fatigue and fever.  Eyes: Negative for redness, itching and visual disturbance.  Respiratory: Negative for shortness of breath.   Cardiovascular: Negative for chest pain and leg swelling.  Gastrointestinal: Negative for constipation, diarrhea, nausea and vomiting.  Genitourinary: Negative for decreased urine volume and difficulty urinating.  Musculoskeletal: Positive for back pain, gait problem and myalgias. Negative for arthralgias.  Skin: Negative for color change, rash and wound.  Neurological: Negative for dizziness, syncope, weakness, light-headedness and headaches.  Physical Exam Triage Vital Signs ED Triage Vitals  Enc Vitals Group     BP 10/15/18 1121 (!) 150/92     Pulse Rate 10/15/18 1121 73     Resp 10/15/18 1121 16     Temp 10/15/18 1121 97.8 F (36.6 C)     Temp src --      SpO2 10/15/18 1121 99 %     Weight --      Height --      Head Circumference --      Peak Flow --      Pain Score 10/15/18 1122 9     Pain Loc --      Pain Edu? --      Excl. in [place]? --    No data found.  Updated Vital Signs BP (!) 150/92   Pulse 73   Temp 97.8 F (36.6 C)   Resp 16   SpO2 99%   Visual Acuity Right Eye Distance:   Left Eye  Distance:   Bilateral Distance:    Right Eye Near:   Left Eye Near:    Bilateral Near:     Physical Exam Vitals signs and nursing note reviewed.  Constitutional:      Appearance: He is well-developed.  HENT:     Head: Normocephalic and atraumatic.  Eyes:     Conjunctiva/sclera: Conjunctivae normal.  Neck:     Musculoskeletal: Neck supple.  Cardiovascular:     Rate and Rhythm: Normal rate and regular rhythm.     Heart sounds: No murmur.  Pulmonary:     Effort: Pulmonary effort is normal. No respiratory distress.     Breath sounds: Normal breath sounds.  Abdominal:     Palpations: Abdomen is soft.     Tenderness: There is no abdominal tenderness.  Musculoskeletal:     Comments: Lying on left side on exam table, appears slightly uncomfortable  Tenderness to palpation of lumbar spine diffusely midline, no palpable deformity or step-off, no focal tenderness, tenderness throughout bilateral lumbar musculature extending into upper gluteal region.  Negative straight leg raise bilaterally.  Strength 5/5 and equal bilaterally at hips and knees, patellar reflex 2+ bilaterally  Skin:    General: Skin is warm and dry.  Neurological:     Mental Status: He is alert.      UC Treatments / Results  Labs (all labs ordered are listed, but only abnormal results are displayed) Labs Reviewed - No data to display Lab Results  Component Value Date   HGBA1C 13.5 08/23/2013    EKG None  Radiology No results found.  Procedures Procedures (including critical care time)  Medications Ordered in UC Medications  ketorolac (TORADOL) 30 MG/ML injection 30 mg (30 mg Intramuscular Given 10/15/18 1202)    Initial Impression / Assessment and Plan / UC Course  I have reviewed the triage vital signs and the nursing notes.  Pertinent labs & imaging results that were available during my care of the patient were reviewed by me and considered in my medical decision making (see chart for details).      Patient with back pain, likely muscular.  No red flags for cauda equina, neurovascularly intact.  Given length of symptoms x1 year, offered x-ray, patient declined as he did not want to make his ride wait.  Will provide Toradol 30 mg in clinic today, continue with Tylenol and ibuprofen, muscle relaxer as needed. Last A1c but from 2015, no longer on insulin, DM likely uncontrolled, will defer  steroids.  Does have history of PUD, not significant pain relief with Tylenol alone.  Will do ibuprofen temporarily, advised to take anti-inflammatories with food.  Activity modification.Discussed strict return precautions. Patient verbalized understanding and is agreeable with plan.  Final Clinical Impressions(s) / UC Diagnoses   Final diagnoses:  Acute bilateral low back pain without sciatica     Discharge Instructions     We gave you a shot of Toradol today, this should begin working in 30 to 40 minutes Use anti-inflammatories for pain/swelling. You may take up to 800 mg Ibuprofen every 8 hours with food. You may supplement Ibuprofen with Tylenol [phone] mg every 8 hours.   You may use flexeril as needed to help with pain. This is a muscle relaxer and causes sedation- please use only at bedtime or when you will be home and not have to drive/work-begin with 1 tablet, may increase to 2 if needed  Alternate ice and heat Avoid heavy lifting, but please do not lay in bed/couch all day, movement is beneficial  Follow-up if symptoms worsening, changing, developing weakness in legs, worsening pain, numbness or tingling, issues with urination or bowel movement   ED Prescriptions    Medication Sig Dispense Auth. Provider   ibuprofen (ADVIL) 800 MG tablet Take 1 tablet (800 mg total) by mouth 3 (three) times daily. 21 tablet ,  C, PA-C   cyclobenzaprine (FLEXERIL) 5 MG tablet Take 1-2 tablets (5-10 mg total) by mouth 2 (two) times daily as needed for muscle spasms. 24 tablet , [place]  C, PA-C     Controlled Substance Prescriptions  Controlled Substance Registry consulted? Not Applicable      Janith Lima, PA-C 10/15/18 1251    , [place] C, [place] 10/15/18 1303

## 2018-10-15 NOTE — Discharge Instructions (Signed)
We gave you a shot of Toradol today, this should begin working in 30 to 40 minutes Use anti-inflammatories for pain/swelling. You may take up to 800 mg Ibuprofen every 8 hours with food. You may supplement Ibuprofen with Tylenol [phone] mg every 8 hours.   You may use flexeril as needed to help with pain. This is a muscle relaxer and causes sedation- please use only at bedtime or when you will be home and not have to drive/work-begin with 1 tablet, may increase to 2 if needed  Alternate ice and heat Avoid heavy lifting, but please do not lay in bed/couch all day, movement is beneficial  Follow-up if symptoms worsening, changing, developing weakness in legs, worsening pain, numbness or tingling, issues with urination or bowel movement

## 2018-10-15 NOTE — ED Triage Notes (Signed)
Pt states chronic back problems from standing at his job a lot. States hes been hurting for a year and has been seen here for it before.

## 2018-10-25 ENCOUNTER — Ambulatory Visit (HOSPITAL_COMMUNITY)
Admission: EM | Admit: 2018-10-25 | Discharge: 2018-10-25 | Disposition: A | Discharge status: Home / Self Care | Payer: Self-pay | Attending: Internal Medicine | Admitting: Internal Medicine

## 2018-10-25 ENCOUNTER — Other Ambulatory Visit: Payer: Self-pay

## 2018-10-25 ENCOUNTER — Encounter (HOSPITAL_COMMUNITY): Payer: Self-pay | Admitting: Emergency Medicine

## 2018-10-25 DIAGNOSIS — M5431 Sciatica, right side: Secondary | ICD-10-CM

## 2018-10-25 MED ORDER — KETOROLAC TROMETHAMINE 60 MG/2ML IM SOLN
INTRAMUSCULAR | Status: AC
  Filled 2018-10-25: qty 2

## 2018-10-25 MED ORDER — KETOROLAC TROMETHAMINE 60 MG/2ML IM SOLN
60.0000 mg | Freq: Once | INTRAMUSCULAR | Status: AC
  Administered 2018-10-25: 13:00:00 60 mg via INTRAMUSCULAR

## 2018-10-25 MED ORDER — METHYLPREDNISOLONE 4 MG PO TBPK: ORAL_TABLET | ORAL | 0 refills | Status: DC

## 2018-10-25 MED ORDER — MELOXICAM 15 MG PO TABS: 15.0000 mg | ORAL_TABLET | Freq: Every day | ORAL | 0 refills | Status: DC

## 2018-10-25 MED ORDER — METHOCARBAMOL 500 MG PO TABS: 500.0000 mg | ORAL_TABLET | Freq: Three times a day (TID) | ORAL | 0 refills | Status: DC | PRN

## 2018-10-25 NOTE — Discharge Instructions (Signed)
Light and regular activity as tolerated.  Avoid heavy lifting. Ice application to low back.  Medrol dosepak as provided. Once complete may start daily meloxicam.  Robaxin as a muscle relaxer. May cause drowsiness. Please do not take if driving or drinking alcohol.   Please follow up with your primary care provider as needed for persistent symptoms.  Wear shoes with good support through the heel.

## 2018-10-25 NOTE — ED Triage Notes (Signed)
Pt here for muscle spasms to legs

## 2018-10-25 NOTE — ED Provider Notes (Signed)
[facility]    CSN: [id number] Arrival date & time: 10/25/18  1223     History   Chief Complaint Chief Complaint  Patient presents with  . Leg Pain    HPI John Howell is a 53 y.o. male.   John Howell presents with complaints of right sided low back pain after heavy lifting, which has worsened over the past few days. Was seen here 5/12 with low back pain but it has now worsened. Radiates to buttocks and right thigh. Burning and sharp pain. Activity does seem to help it. Worse with laying flat. Feels like his big toe of right foot experiences numbness. Has been taking ibuprofen which hasn't helped. Was taking flexeril which minimally helped, now is out of it. He works at [employer], is on his feet a lot. Hasn't taken any medications for his pain today. No saddle paresthesia. No loss of bladder or bowel function. Hx of htn, peptic ulcer, dm.      ROS per HPI, negative if not otherwise mentioned.      Past Medical History:  Diagnosis Date  . Diabetes mellitus without complication ([place])   . Hypertension   . Peptic ulcer     There are no active problems to display for this patient.   History reviewed. No pertinent surgical history.     Home Medications    Prior to Admission medications   Medication Sig Start Date End Date Taking? Authorizing Provider  acetaminophen (TYLENOL) 500 MG tablet Take 500-1,000 mg by mouth every 6 (six) hours as needed for mild pain, moderate pain, fever or headache.    [provider]  cyclobenzaprine (FLEXERIL) 5 MG tablet Take 1-2 tablets (5-10 mg total) by mouth 2 (two) times daily as needed for muscle spasms. 10/15/18   Wieters, Hallie C, PA-C  glucose blood test strip Use as instructed Patient not taking: Reported on 12/13/2017 08/23/13   Reyne Dumas, MD  glucose monitoring kit (FREESTYLE) monitoring kit 1 each by Does not apply route as needed for other. Patient not taking: Reported on 12/13/2017 08/23/13    Reyne Dumas, MD  ibuprofen (ADVIL) 800 MG tablet Take 1 tablet (800 mg total) by mouth 3 (three) times daily. 10/15/18   Wieters, Hallie C, PA-C  insulin NPH-regular Human (NOVOLIN 70/30) (70-30) 100 UNIT/ML injection 25 units in the morning and 25 units in the evening Patient not taking: Reported on 08/25/2014 08/23/13   Reyne Dumas, MD  meloxicam (MOBIC) 15 MG tablet Take 1 tablet (15 mg total) by mouth daily. May start once Medrol Dosepak is completed 10/25/18   Augusto Gamble B, NP  metFORMIN (GLUMETZA) 500 MG (MOD) 24 hr tablet Take 500 mg by mouth daily with breakfast.    [provider]  methocarbamol (ROBAXIN) 500 MG tablet Take 1 tablet (500 mg total) by mouth every 8 (eight) hours as needed for muscle spasms. 10/25/18   Zigmund Gottron, NP  methylPREDNISolone (MEDROL DOSEPAK) 4 MG TBPK tablet Per box instructions 10/25/18   Zigmund Gottron, NP    Family History History reviewed. No pertinent family history.  Social History Social History   Tobacco Use  . Smoking status: Current Every Day Smoker    Types: Cigarettes  . Tobacco comment: 2  a day  Substance Use Topics  . Alcohol use: Yes    Alcohol/week: 42.0 standard drinks    Types: 42 Cans of beer per week    Comment: 6 pack a day  . Drug use:  No     Allergies   Patient has no known allergies.   Review of Systems Review of Systems   Physical Exam Triage Vital Signs ED Triage Vitals  Enc Vitals Group     BP 10/25/18 1245 (!) 171/99     Pulse Rate 10/25/18 1245 85     Resp 10/25/18 1245 18     Temp 10/25/18 1245 98.7 F (37.1 C)     Temp Source 10/25/18 1245 Oral     SpO2 10/25/18 1245 100 %     Weight --      Height --      Head Circumference --      Peak Flow --      Pain Score 10/25/18 1246 8     Pain Loc --      Pain Edu? --      Excl. in [place]? --    No data found.  Updated Vital Signs BP (!) 171/99 (BP Location: Left Arm)   Pulse 85   Temp 98.7 F (37.1 C) (Oral)   Resp 18   SpO2  100%   Visual Acuity Right Eye Distance:   Left Eye Distance:   Bilateral Distance:    Right Eye Near:   Left Eye Near:    Bilateral Near:     Physical Exam Constitutional:      Appearance: He is well-developed.  Cardiovascular:     Rate and Rhythm: Normal rate and regular rhythm.  Pulmonary:     Effort: Pulmonary effort is normal.     Breath sounds: Normal breath sounds.  Musculoskeletal:     Lumbar back: He exhibits tenderness and pain. He exhibits no bony tenderness, no swelling, no edema, no laceration, no spasm and normal pulse.     Comments: Right low back pain on palpation; pain with right straight leg raise; pain with ambulation with weight bearing on right leg, but ambulatory without difficulty; no increased pain with toe touch or heel touch weight bearing; gross sensation intact; pain with transition from sit to lay and lay to sit   Skin:    General: Skin is warm and dry.  Neurological:     Mental Status: He is alert and oriented to person, place, and time.      UC Treatments / Results  Labs (all labs ordered are listed, but only abnormal results are displayed) Labs Reviewed - No data to display  EKG None  Radiology No results found.  Procedures Procedures (including critical care time)  Medications Ordered in UC Medications  ketorolac (TORADOL) injection 60 mg (60 mg Intramuscular Given 10/25/18 1324)    Initial Impression / Assessment and Plan / UC Course  I have reviewed the triage vital signs and the nursing notes.  Pertinent labs & imaging results that were available during my care of the patient were reviewed by me and considered in my medical decision making (see chart for details).     No red flag findings today. Consistent with sciatica. Medrol pack provided and will see if robaxin provides improved management. Return precautions provided. Patient verbalized understanding and agreeable to plan.  Ambulatory out of clinic without difficulty.     Final Clinical Impressions(s) / UC Diagnoses   Final diagnoses:  Sciatica of right side     Discharge Instructions     Light and regular activity as tolerated.  Avoid heavy lifting. Ice application to low back.  Medrol dosepak as provided. Once complete may start daily meloxicam.  Robaxin  as a muscle relaxer. May cause drowsiness. Please do not take if driving or drinking alcohol.   Please follow up with your primary care provider as needed for persistent symptoms.  Wear shoes with good support through the heel.    ED Prescriptions    Medication Sig Dispense Auth. Provider   methylPREDNISolone (MEDROL DOSEPAK) 4 MG TBPK tablet Per box instructions 21 tablet Burky, Natalie B, NP   methocarbamol (ROBAXIN) 500 MG tablet Take 1 tablet (500 mg total) by mouth every 8 (eight) hours as needed for muscle spasms. 20 tablet Augusto Gamble B, NP   meloxicam (MOBIC) 15 MG tablet Take 1 tablet (15 mg total) by mouth daily. May start once Medrol Dosepak is completed 20 tablet Zigmund Gottron, NP     Controlled Substance Prescriptions [place] Controlled Substance Registry consulted? Not Applicable   Zigmund Gottron, NP 10/25/18 1555

## 2018-10-28 ENCOUNTER — Telehealth (HOSPITAL_COMMUNITY): Payer: Self-pay | Admitting: Emergency Medicine

## 2018-10-28 MED ORDER — MELOXICAM 15 MG PO TABS: 15.0000 mg | ORAL_TABLET | Freq: Every day | ORAL | 0 refills | Status: DC

## 2018-10-28 MED ORDER — CYCLOBENZAPRINE HCL 5 MG PO TABS: 5.0000 mg | ORAL_TABLET | Freq: Two times a day (BID) | ORAL | 0 refills | Status: DC | PRN

## 2018-10-28 MED ORDER — METHYLPREDNISOLONE 4 MG PO TBPK: ORAL_TABLET | ORAL | 0 refills | Status: DC

## 2018-11-10 ENCOUNTER — Emergency Department (HOSPITAL_COMMUNITY)
Admission: EM | Admit: 2018-11-10 | Discharge: 2018-11-11 | Disposition: A | Discharge status: Home / Self Care | Payer: Self-pay | Attending: Emergency Medicine | Admitting: Emergency Medicine

## 2018-11-10 ENCOUNTER — Encounter (HOSPITAL_COMMUNITY): Payer: Self-pay

## 2018-11-10 ENCOUNTER — Other Ambulatory Visit: Payer: Self-pay

## 2018-11-10 DIAGNOSIS — Z79899 Other long term (current) drug therapy: Secondary | ICD-10-CM | POA: Insufficient documentation

## 2018-11-10 DIAGNOSIS — Z7984 Long term (current) use of oral hypoglycemic drugs: Secondary | ICD-10-CM | POA: Insufficient documentation

## 2018-11-10 DIAGNOSIS — E1165 Type 2 diabetes mellitus with hyperglycemia: Secondary | ICD-10-CM | POA: Insufficient documentation

## 2018-11-10 DIAGNOSIS — R739 Hyperglycemia, unspecified: Secondary | ICD-10-CM

## 2018-11-10 DIAGNOSIS — F17210 Nicotine dependence, cigarettes, uncomplicated: Secondary | ICD-10-CM | POA: Insufficient documentation

## 2018-11-10 DIAGNOSIS — I10 Essential (primary) hypertension: Secondary | ICD-10-CM | POA: Insufficient documentation

## 2018-11-10 DIAGNOSIS — Z760 Encounter for issue of repeat prescription: Secondary | ICD-10-CM

## 2018-11-10 LAB — CBG MONITORING, ED
Glucose-Capillary: 300 mg/dL — ABNORMAL HIGH (ref 70–99)
Glucose-Capillary: 209 mg/dL — ABNORMAL HIGH (ref 70–99)

## 2018-11-10 MED ORDER — IBUPROFEN 800 MG PO TABS: 800.0000 mg | ORAL_TABLET | Freq: Three times a day (TID) | ORAL | 0 refills | Status: DC

## 2018-11-10 MED ORDER — GLUCOSE BLOOD VI STRP: ORAL_STRIP | 12 refills | Status: AC

## 2018-11-10 MED ORDER — INSULIN ASPART 100 UNIT/ML ~~LOC~~ SOLN
5.0000 [IU] | Freq: Once | SUBCUTANEOUS | Status: AC
  Administered 2018-11-10: 12:00:00 5 [IU] via SUBCUTANEOUS

## 2018-11-10 MED ORDER — METFORMIN HCL ER (MOD) 500 MG PO TB24: 500.0000 mg | ORAL_TABLET | Freq: Every day | ORAL | 0 refills | Status: DC

## 2018-11-10 MED ORDER — FREESTYLE SYSTEM KIT: 1.0000 | PACK | 2 refills | Status: AC | PRN

## 2018-11-10 MED ORDER — INSULIN ASPART 100 UNIT/ML ~~LOC~~ SOLN: 5.0000 [IU] | Freq: Once | SUBCUTANEOUS | Status: DC

## 2018-11-10 NOTE — ED Notes (Signed)
Urinal at bedside.  

## 2018-11-10 NOTE — ED Notes (Signed)
Complains of slight headache

## 2018-11-10 NOTE — ED Provider Notes (Signed)
[facility] EMERGENCY DEPARTMENT Provider Note   CSN: [id number] Arrival date & time: 11/10/18  1154    History   Chief Complaint Chief Complaint  Patient presents with  . Hyperglycemia    HPI John Howell is a 53 y.o. male.     The history is provided by the patient. No language interpreter was used.  Hyperglycemia     53 year old male with history of insulin-dependent diabetic presenting for concerns of high blood sugar.  Patient brought here via EMS.  Patient report he was kicked out of his place 1 week ago and was unable to retrieve his medication for his diabetes.  He is currently staying at his mom's house.  For the past week he does not have access to his metformin.  He did notice increased thirst as well as increased urination along with generalized weakness consistent with hypoglycemia that he has experienced in the past.  He is no longer on insulin for the past several years.  He is taking metformin only.  He denies any recent sickness, no exposure to anyone with COVID-19.  Aside from right sciatic pain that he experienced on occasion he has no other symptoms.  He does not have a primary care provider at this time.  Past Medical History:  Diagnosis Date  . Diabetes mellitus without complication ([place])   . Hypertension   . Peptic ulcer     There are no active problems to display for this patient.   No past surgical history on file.      Home Medications    Prior to Admission medications   Medication Sig Start Date End Date Taking? Authorizing Provider  acetaminophen (TYLENOL) 500 MG tablet Take 500-1,000 mg by mouth every 6 (six) hours as needed for mild pain, moderate pain, fever or headache.    [provider]  cyclobenzaprine (FLEXERIL) 5 MG tablet Take 1-2 tablets (5-10 mg total) by mouth 2 (two) times daily as needed for muscle spasms. 10/28/18   Augusto Gamble B, NP  glucose blood test strip Use as instructed Patient not taking:  Reported on 12/13/2017 08/23/13   Reyne Dumas, MD  glucose monitoring kit (FREESTYLE) monitoring kit 1 each by Does not apply route as needed for other. Patient not taking: Reported on 12/13/2017 08/23/13   Reyne Dumas, MD  ibuprofen (ADVIL) 800 MG tablet Take 1 tablet (800 mg total) by mouth 3 (three) times daily. 10/15/18   Wieters, Hallie C, PA-C  insulin NPH-regular Human (NOVOLIN 70/30) (70-30) 100 UNIT/ML injection 25 units in the morning and 25 units in the evening Patient not taking: Reported on 08/25/2014 08/23/13   Reyne Dumas, MD  meloxicam (MOBIC) 15 MG tablet Take 1 tablet (15 mg total) by mouth daily. May start once Medrol Dosepak is completed 10/28/18   Augusto Gamble B, NP  metFORMIN (GLUMETZA) 500 MG (MOD) 24 hr tablet Take 500 mg by mouth daily with breakfast.    [provider]  methocarbamol (ROBAXIN) 500 MG tablet Take 1 tablet (500 mg total) by mouth every 8 (eight) hours as needed for muscle spasms. 10/25/18   Zigmund Gottron, NP  methylPREDNISolone (MEDROL DOSEPAK) 4 MG TBPK tablet Per box instructions 10/28/18   Zigmund Gottron, NP    Family History No family history on file.  Social History Social History   Tobacco Use  . Smoking status: Current Every Day Smoker    Types: Cigarettes  . Tobacco comment: 2  a day  Substance Use  Topics  . Alcohol use: Yes    Alcohol/week: 42.0 standard drinks    Types: 42 Cans of beer per week    Comment: 6 pack a day  . Drug use: No     Allergies   Patient has no known allergies.   Review of Systems Review of Systems  All other systems reviewed and are negative.    Physical Exam Updated Vital Signs BP (!) 130/102   Pulse (!) 111   Temp 98.5 F (36.9 C) (Oral)   Resp 18   Ht 6' (1.829 m)   Wt 72.6 kg   SpO2 100%   BMI 21.70 kg/m   Physical Exam Vitals signs and nursing note reviewed.  Constitutional:      General: He is not in acute distress.    Appearance: He is well-developed.  HENT:      Head: Atraumatic.     Mouth/Throat:     Mouth: Mucous membranes are moist.  Eyes:     Conjunctiva/sclera: Conjunctivae normal.  Neck:     Musculoskeletal: Neck supple.  Cardiovascular:     Rate and Rhythm: Normal rate and regular rhythm.     Pulses: Normal pulses.     Heart sounds: Normal heart sounds.  Pulmonary:     Effort: Pulmonary effort is normal.     Breath sounds: Normal breath sounds.  Abdominal:     General: Abdomen is flat.     Palpations: Abdomen is soft.     Tenderness: There is no abdominal tenderness.  Skin:    Findings: No rash.  Neurological:     Mental Status: He is alert and oriented to person, place, and time.  Psychiatric:        Mood and Affect: Mood normal.      ED Treatments / Results  Labs (all labs ordered are listed, but only abnormal results are displayed) Labs Reviewed  CBG MONITORING, ED - Abnormal; Notable for the following components:      Result Value   Glucose-Capillary 300 (*)    All other components within normal limits  CBG MONITORING, ED - Abnormal; Notable for the following components:   Glucose-Capillary 209 (*)    All other components within normal limits    EKG None  Radiology No results found.  Procedures Procedures (including critical care time)  Medications Ordered in ED Medications  insulin aspart (novoLOG) injection 5 Units (5 Units Subcutaneous Given 11/10/18 1215)     Initial Impression / Assessment and Plan / ED Course  I have reviewed the triage vital signs and the nursing notes.  Pertinent labs & imaging results that were available during my care of the patient were reviewed by me and considered in my medical decision making (see chart for details).        BP (!) 130/102   Pulse (!) 111   Temp 98.5 F (36.9 C) (Oral)   Resp 18   Ht 6' (1.829 m)   Wt 72.6 kg   SpO2 100%   BMI 21.70 kg/m    Final Clinical Impressions(s) / ED Diagnoses   Final diagnoses:  Hyperglycemia  Medication refill     ED Discharge Orders         Ordered    glucose blood test strip     11/10/18 1315    glucose monitoring kit (FREESTYLE) monitoring kit  As needed     11/10/18 1315    ibuprofen (ADVIL) 800 MG tablet  3 times daily  11/10/18 1315    metFORMIN (GLUMETZA) 500 MG (MOD) 24 hr tablet  Daily with breakfast     11/10/18 1315         12:11 PM Patient here with concerns of high blood sugar due to not being on his metformin for the past week.  Current CBG is 300.  No infectious symptoms other stressors that can explain his hyperglycemic state.  I have low suspicion for DKA.  No recent sick contact.  Will treat hyperglycemia, will give refill of his metformin as well as provide outpatient follow-up for further care.   Domenic Moras, PA-C 11/10/18 1317    Valarie Merino, MD 11/12/18 1534

## 2018-11-10 NOTE — ED Triage Notes (Addendum)
Patient  Has  Not had medication in 1 week    gcb 367 in ambulatnce, gcb 300 on arrival to ed

## 2018-11-14 MED FILL — !TRUE METRIX BLOOD GLUCOSE: 1 days supply | Qty: 1 | Fill #0

## 2018-11-14 MED FILL — TRUE METRIX GLUCOSE TEST ST: 25 days supply | Qty: 100 | Fill #0

## 2018-11-20 ENCOUNTER — Ambulatory Visit: Payer: Self-pay | Admitting: Family Medicine

## 2018-11-22 ENCOUNTER — Other Ambulatory Visit: Payer: Self-pay

## 2018-11-22 ENCOUNTER — Ambulatory Visit (HOSPITAL_COMMUNITY)
Admission: EM | Admit: 2018-11-22 | Discharge: 2018-11-22 | Disposition: A | Discharge status: Home / Self Care | Payer: Self-pay | Attending: Internal Medicine | Admitting: Internal Medicine

## 2018-11-22 ENCOUNTER — Encounter (HOSPITAL_COMMUNITY): Payer: Self-pay

## 2018-11-22 DIAGNOSIS — M5431 Sciatica, right side: Secondary | ICD-10-CM

## 2018-11-22 MED ORDER — METHOCARBAMOL 500 MG PO TABS: 500.0000 mg | ORAL_TABLET | Freq: Three times a day (TID) | ORAL | 0 refills | Status: DC | PRN

## 2018-11-22 MED ORDER — MELOXICAM 7.5 MG PO TABS: 7.5000 mg | ORAL_TABLET | Freq: Every day | ORAL | 0 refills | Status: DC

## 2018-11-22 MED FILL — METHOCARBAMOL 500 MG TABS: 500 | 5 days supply | Qty: 15 | Fill #0

## 2018-11-22 MED FILL — MELOXICAM 7.5 MG TABLET: 7.5 | 20 days supply | Qty: 20 | Fill #0

## 2018-11-22 NOTE — ED Provider Notes (Signed)
[facility]    CSN: [id number] Arrival date & time: 11/22/18  1017      History   Chief Complaint Chief Complaint  Patient presents with  . Sciatic Pain    HPI John Howell is a 53 y.o. male.   John Howell presents with complaints of persistent right sided sciatica. Pain into right buttock which radiates down thigh and to right great toe. No saddle paresthesia, or loss of bladder or bowel function. Was seen here for similar after heavy lifting 5/22. States he had just started medications prescribed, but then had to leave his living arrangements and therefore lost his medications and was unable to continue. States he briefly took them and they seemed to help. No new injury. No weakness. Hasn't been taking anything for pain. Was given steroid pack and then had to go to the ER for elevated blood sugars, although wasn't taking his metformin. States he has been taking this now, is not taking insulin. Has an appointment with his PCP next week he says.    ROS per HPI, negative if not otherwise mentioned.      Past Medical History:  Diagnosis Date  . Diabetes mellitus without complication ([place])   . Hypertension   . Peptic ulcer     There are no active problems to display for this patient.   History reviewed. No pertinent surgical history.     Home Medications    Prior to Admission medications   Medication Sig Start Date End Date Taking? Authorizing Provider  acetaminophen (TYLENOL) 500 MG tablet Take 500-1,000 mg by mouth every 6 (six) hours as needed for mild pain, moderate pain, fever or headache.    [provider]  glucose blood test strip Use as instructed 11/10/18   Domenic Moras, PA-C  glucose monitoring kit (FREESTYLE) monitoring kit 1 each by Does not apply route as needed for other. 11/10/18   Domenic Moras, PA-C  insulin NPH-regular Human (NOVOLIN 70/30) (70-30) 100 UNIT/ML injection 25 units in the morning and 25 units in the evening Patient not  taking: Reported on 08/25/2014 08/23/13   Reyne Dumas, MD  meloxicam (MOBIC) 7.5 MG tablet Take 1 tablet (7.5 mg total) by mouth daily. 11/22/18   Zigmund Gottron, NP  metFORMIN (GLUMETZA) 500 MG (MOD) 24 hr tablet Take 1 tablet (500 mg total) by mouth daily with breakfast. 11/10/18   Domenic Moras, PA-C  methocarbamol (ROBAXIN) 500 MG tablet Take 1 tablet (500 mg total) by mouth every 8 (eight) hours as needed for muscle spasms. 11/22/18   Zigmund Gottron, NP  methylPREDNISolone (MEDROL DOSEPAK) 4 MG TBPK tablet Per box instructions 10/28/18   Zigmund Gottron, NP    Family History Family History  Family history unknown: Yes    Social History Social History   Tobacco Use  . Smoking status: Current Every Day Smoker    Types: Cigarettes  . Tobacco comment: 2  a day  Substance Use Topics  . Alcohol use: Yes    Alcohol/week: 42.0 standard drinks    Types: 42 Cans of beer per week    Comment: 6 pack a day  . Drug use: No     Allergies   Patient has no known allergies.   Review of Systems Review of Systems   Physical Exam Triage Vital Signs ED Triage Vitals  Enc Vitals Group     BP 11/22/18 1047 (!) 127/94     Pulse Rate 11/22/18 1047 92  Resp 11/22/18 1047 16     Temp 11/22/18 1047 98.2 F (36.8 C)     Temp Source 11/22/18 1047 Oral     SpO2 11/22/18 1047 99 %     Weight --      Height --      Head Circumference --      Peak Flow --      Pain Score 11/22/18 1104 8     Pain Loc --      Pain Edu? --      Excl. in [place]? --    No data found.  Updated Vital Signs BP (!) 127/94 (BP Location: Left Arm) Comment: reported BP to Nurse Christina Soto  Pulse 92   Temp 98.2 F (36.8 C) (Oral)   Resp 16   SpO2 99%   Visual Acuity Right Eye Distance:   Left Eye Distance:   Bilateral Distance:    Right Eye Near:   Left Eye Near:    Bilateral Near:     Physical Exam Constitutional:      Appearance: He is well-developed.  Cardiovascular:     Rate and Rhythm:  Normal rate and regular rhythm.  Pulmonary:     Effort: Pulmonary effort is normal.     Breath sounds: Normal breath sounds.  Musculoskeletal:     Lumbar back: He exhibits pain.     Comments: Right low back pain which radiates into buttocks and thigh; full ROM of back and legs; ambulatory without difficulty; strength equal bilaterally; gross sensation intact; pain with right straight leg raise   Skin:    General: Skin is warm and dry.  Neurological:     Mental Status: He is alert and oriented to person, place, and time.      UC Treatments / Results  Labs (all labs ordered are listed, but only abnormal results are displayed) Labs Reviewed - No data to display  EKG None  Radiology No results found.  Procedures Procedures (including critical care time)  Medications Ordered in UC Medications - No data to display  Initial Impression / Assessment and Plan / UC Course  I have reviewed the triage vital signs and the nursing notes.  Pertinent labs & imaging results that were available during my care of the patient were reviewed by me and considered in my medical decision making (see chart for details).     No red flag findings. Will hold off on steroids as blood sugar elevated and unsure how compliant patient is with checking and taking his medications. Daily meloxicam and prn robaxin provided. Return precautions provided. If symptoms worsen or do not improve in the next week to return to be seen or to follow up with PCP.  Patient verbalized understanding and agreeable to plan.   Ambulatory out of clinic without difficulty.   Final Clinical Impressions(s) / UC Diagnoses   Final diagnoses:  Sciatica of right side     Discharge Instructions     Daily meloxicam, take with food.  Robaxin as needed for muscle spasms. May cause drowsiness. Please do not take if driving or drinking alcohol.   Continue to follow with your primary care provider as needed for persistent symptoms.     ED Prescriptions    Medication Sig Dispense Auth. Provider   methocarbamol (ROBAXIN) 500 MG tablet Take 1 tablet (500 mg total) by mouth every 8 (eight) hours as needed for muscle spasms. 15 tablet Augusto Gamble B, NP   meloxicam (MOBIC) 7.5 MG tablet Take  1 tablet (7.5 mg total) by mouth daily. 20 tablet Zigmund Gottron, NP     Controlled Substance Prescriptions [place] Controlled Substance Registry consulted? Not Applicable   Zigmund Gottron, NP 11/22/18 1206

## 2018-11-22 NOTE — Discharge Instructions (Signed)
Daily meloxicam, take with food.  Robaxin as needed for muscle spasms. May cause drowsiness. Please do not take if driving or drinking alcohol.   Continue to follow with your primary care provider as needed for persistent symptoms.

## 2018-11-22 NOTE — ED Triage Notes (Signed)
Pt presents with sciatic nerve pain on right side from lower back down his leg for over a month.

## 2018-11-27 ENCOUNTER — Encounter: Payer: Self-pay | Admitting: Family Medicine

## 2018-11-27 ENCOUNTER — Ambulatory Visit (INDEPENDENT_AMBULATORY_CARE_PROVIDER_SITE_OTHER): Payer: Self-pay | Admitting: Family Medicine

## 2018-11-27 ENCOUNTER — Other Ambulatory Visit: Payer: Self-pay

## 2018-11-27 VITALS — BP 118/90 | HR 84 | Temp 98.4°F | Ht 72.0 in | Wt 144.0 lb

## 2018-11-27 DIAGNOSIS — F1010 Alcohol abuse, uncomplicated: Secondary | ICD-10-CM

## 2018-11-27 DIAGNOSIS — E1169 Type 2 diabetes mellitus with other specified complication: Secondary | ICD-10-CM

## 2018-11-27 DIAGNOSIS — F17210 Nicotine dependence, cigarettes, uncomplicated: Secondary | ICD-10-CM

## 2018-11-27 DIAGNOSIS — Z7689 Persons encountering health services in other specified circumstances: Secondary | ICD-10-CM

## 2018-11-27 DIAGNOSIS — M5431 Sciatica, right side: Secondary | ICD-10-CM

## 2018-11-27 LAB — LIPID PANEL
Cholesterol: 189 mg/dL (ref 0–200)
HDL: 56.1 mg/dL (ref >39.00)
NonHDL: 132.64
Total CHOL/HDL Ratio: 3
Triglycerides: 281 mg/dL — ABNORMAL HIGH (ref 0.0–149.0)
VLDL: 56.2 mg/dL — ABNORMAL HIGH (ref 0.0–40.0)

## 2018-11-27 LAB — LDL CHOLESTEROL, DIRECT: Direct LDL: 93 mg/dL

## 2018-11-27 LAB — COMPREHENSIVE METABOLIC PANEL
ALT: 17 U/L (ref 0–53)
AST: 16 U/L (ref 0–37)
Albumin: 4.1 g/dL (ref 3.5–5.2)
Alkaline Phosphatase: 48 U/L (ref 39–117)
BUN: 14 mg/dL (ref 6–23)
CO2: 30 mEq/L (ref 19–32)
Calcium: 9.5 mg/dL (ref 8.4–10.5)
Chloride: 105 mEq/L (ref 96–112)
Creatinine, Ser: 0.89 mg/dL (ref 0.40–1.50)
GFR: 108.35 mL/min (ref >60.00)
Glucose, Bld: 109 mg/dL — ABNORMAL HIGH (ref 70–99)
Potassium: 4.5 mEq/L (ref 3.5–5.1)
Sodium: 139 mEq/L (ref 135–145)
Total Bilirubin: 0.4 mg/dL (ref 0.2–1.2)
Total Protein: 6.6 g/dL (ref 6.0–8.3)

## 2018-11-27 LAB — CBC
HCT: 39.9 % (ref 39.0–52.0)
Hemoglobin: 13.5 g/dL (ref 13.0–17.0)
MCHC: 33.8 g/dL (ref 30.0–36.0)
MCV: 92.6 fl (ref 78.0–100.0)
Platelets: 251 10*3/uL (ref 150.0–400.0)
RBC: 4.31 Mil/uL (ref 4.22–5.81)
RDW: 12.6 % (ref 11.5–15.5)
WBC: 7.2 10*3/uL (ref 4.0–10.5)

## 2018-11-27 LAB — POCT GLYCOSYLATED HEMOGLOBIN (HGB A1C): Hemoglobin A1C: 8.2 % — AB (ref 4.0–5.6)

## 2018-11-27 MED ORDER — METHOCARBAMOL 500 MG PO TABS: 500.0000 mg | ORAL_TABLET | Freq: Three times a day (TID) | ORAL | 0 refills | Status: DC | PRN

## 2018-11-27 MED FILL — METHOCARBAMOL 500 MG TABS: 500 | 20 days supply | Qty: 60 | Fill #0

## 2018-11-27 NOTE — Patient Instructions (Signed)
Sciatica  Sciatica is pain, numbness, weakness, or tingling along the path of the sciatic nerve. The sciatic nerve starts in the lower back and runs down the back of each leg. The nerve controls the muscles in the lower leg and in the back of the knee. It also provides feeling (sensation) to the back of the thigh, the lower leg, and the sole of the foot. Sciatica is a symptom of another medical condition that pinches or puts pressure on the sciatic nerve. Generally, sciatica only affects one side of the body. Sciatica usually goes away on its own or with treatment. In some cases, sciatica may keep coming back (recur). What are the causes? This condition is caused by pressure on the sciatic nerve, or pinching of the sciatic nerve. This may be the result of:  A disk in between the bones of the spine (vertebrae) bulging out too far (herniated disk).  Age-related changes in the spinal disks (degenerative disk disease).  A pain disorder that affects a muscle in the buttock (piriformis syndrome).  Extra bone growth (bone spur) near the sciatic nerve.  An injury or break (fracture) of the pelvis.  Pregnancy.  Tumor (rare). What increases the risk? The following factors may make you more likely to develop this condition:  Playing sports that place pressure or stress on the spine, such as football or weight lifting.  Having poor strength and flexibility.  A history of back injury.  A history of back surgery.  Sitting for long periods of time.  Doing activities that involve repetitive bending or lifting.  Obesity. What are the signs or symptoms? Symptoms can vary from mild to very severe, and they may include:  Any of these problems in the lower back, leg, hip, or buttock: ? Mild tingling or dull aches. ? Burning sensations. ? Sharp pains.  Numbness in the back of the calf or the sole of the foot.  Leg weakness.  Severe back pain that makes movement difficult. These symptoms  may get worse when you cough, sneeze, or laugh, or when you sit or stand for long periods of time. Being overweight may also make symptoms worse. In some cases, symptoms may recur over time. How is this diagnosed? This condition may be diagnosed based on:  Your symptoms.  A physical exam. Your health care provider may ask you to do certain movements to check whether those movements trigger your symptoms.  You may have tests, including: ? Blood tests. ? X-rays. ? MRI. ? CT scan. How is this treated? In many cases, this condition improves on its own, without any treatment. However, treatment may include:  Reducing or modifying physical activity during periods of pain.  Exercising and stretching to strengthen your abdomen and improve the flexibility of your spine.  Icing and applying heat to the affected area.  Medicines that help: ? To relieve pain and swelling. ? To relax your muscles.  Injections of medicines that help to relieve pain, irritation, and inflammation around the sciatic nerve (steroids).  Surgery. Follow these instructions at home: Medicines  Take over-the-counter and prescription medicines only as told by your health care provider.  Do not drive or operate heavy machinery while taking prescription pain medicine. Managing pain  If directed, apply ice to the affected area. ? Put ice in a plastic bag. ? Place a towel between your skin and the bag. ? Leave the ice on for 20 minutes, 2-3 times a day.  After icing, apply heat to the affected  area before you exercise or as often as told by your health care provider. Use the heat source that your health care provider recommends, such as a moist heat pack or a heating pad. ? Place a towel between your skin and the heat source. ? Leave the heat on for 20-30 minutes. ? Remove the heat if your skin turns bright red. This is especially important if you are unable to feel pain, heat, or cold. You may have a greater risk  of getting burned. Activity  Return to your normal activities as told by your health care provider. Ask your health care provider what activities are safe for you. ? Avoid activities that make your symptoms worse.  Take brief periods of rest throughout the day. Resting in a lying or standing position is usually better than sitting to rest. ? When you rest for longer periods, mix in some mild activity or stretching between periods of rest. This will help to prevent stiffness and pain. ? Avoid sitting for long periods of time without moving. Get up and move around at least one time each hour.  Exercise and stretch regularly, as told by your health care provider.  Do not lift anything that is heavier than 10 lb (4.5 kg) while you have symptoms of sciatica. When you do not have symptoms, you should still avoid heavy lifting, especially repetitive heavy lifting.  When you lift objects, always use proper lifting technique, which includes: ? Bending your knees. ? Keeping the load close to your body. ? Avoiding twisting. General instructions  Use good posture. ? Avoid leaning forward while sitting. ? Avoid hunching over while standing.  Maintain a healthy weight. Excess weight puts extra stress on your back and makes it difficult to maintain good posture.  Wear supportive, comfortable shoes. Avoid wearing high heels.  Avoid sleeping on a mattress that is too soft or too hard. A mattress that is firm enough to support your back when you sleep may help to reduce your pain.  Keep all follow-up visits as told by your health care provider. This is important. Contact a health care provider if:  You have pain that wakes you up when you are sleeping.  You have pain that gets worse when you lie down.  Your pain is worse than you have experienced in the past.  Your pain lasts longer than 4 weeks.  You experience unexplained weight loss. Get help right away if:  You lose control of your  bowel or bladder (incontinence).  You have: ? Weakness in your lower back, pelvis, buttocks, or legs that gets worse. ? Redness or swelling of your back. ? A burning sensation when you urinate. This information is not intended to replace advice given to you by your health care provider. Make sure you discuss any questions you have with your health care provider. Document Released: 05/16/2001 Document Revised: 10/26/2015 Document Reviewed: 01/29/2015 Elsevier Interactive Patient Education  2019 [employer]  Sciatica Rehab Ask your health care provider which exercises are safe for you. Do exercises exactly as told by your health care provider and adjust them as directed. It is normal to feel mild stretching, pulling, tightness, or discomfort as you do these exercises, but you should stop right away if you feel sudden pain or your pain gets worse.Do not begin these exercises until told by your health care provider. Stretching and range of motion exercises These exercises warm up your muscles and joints and improve the movement and flexibility of your  hips and your back. These exercises also help to relieve pain, numbness, and tingling. Exercise A: Sciatic nerve glide 1. Sit in a chair with your head facing down toward your chest. Place your hands behind your back. Let your shoulders slump forward. 2. Slowly straighten one of your knees while you tilt your head back as if you are looking toward the ceiling. Only straighten your leg as far as you can without making your symptoms worse. 3. Hold for __________ seconds. 4. Slowly return to the starting position. 5. Repeat with your other leg. Repeat __________ times. Complete this exercise __________ times a day. Exercise B: Knee to chest with hip adduction and internal rotation  1. Lie on your back on a firm surface with both legs straight. 2. Bend one of your knees and move it up toward your chest until you feel a gentle stretch in your lower  back and buttock. Then, move your knee toward the shoulder that is on the opposite side from your leg. ? Hold your leg in this position by holding onto the front of your knee. 3. Hold for __________ seconds. 4. Slowly return to the starting position. 5. Repeat with your other leg. Repeat __________ times. Complete this exercise __________ times a day. Exercise C: Prone extension on elbows  1. Lie on your abdomen on a firm surface. A bed may be too soft for this exercise. 2. Prop yourself up on your elbows. 3. Use your arms to help lift your chest up until you feel a gentle stretch in your abdomen and your lower back. ? This will place some of your body weight on your elbows. If this is uncomfortable, try stacking pillows under your chest. ? Your hips should stay down, against the surface that you are lying on. Keep your hip and back muscles relaxed. 4. Hold for __________ seconds. 5. Slowly relax your upper body and return to the starting position. Repeat __________ times. Complete this exercise __________ times a day. Strengthening exercises These exercises build strength and endurance in your back. Endurance is the ability to use your muscles for a long time, even after they get tired. Exercise D: Pelvic tilt 1. Lie on your back on a firm surface. Bend your knees and keep your feet flat. 2. Tense your abdominal muscles. Tip your pelvis up toward the ceiling and flatten your lower back into the floor. ? To help with this exercise, you may place a small towel under your lower back and try to push your back into the towel. 3. Hold for __________ seconds. 4. Let your muscles relax completely before you repeat this exercise. Repeat __________ times. Complete this exercise __________ times a day. Exercise E: Alternating arm and leg raises  1. Get on your hands and knees on a firm surface. If you are on a hard floor, you may want to use padding to cushion your knees, such as an exercise mat.  2. Line up your arms and legs. Your hands should be below your shoulders, and your knees should be below your hips. 3. Lift your left leg behind you. At the same time, raise your right arm and straighten it in front of you. ? Do not lift your leg higher than your hip. ? Do not lift your arm higher than your shoulder. ? Keep your abdominal and back muscles tight. ? Keep your hips facing the ground. ? Do not arch your back. ? Keep your balance carefully, and do not hold your breath. 4.  Hold for __________ seconds. 5. Slowly return to the starting position and repeat with your right leg and your left arm. Repeat __________ times. Complete this exercise __________ times a day. Posture and body mechanics  Body mechanics refers to the movements and positions of your body while you do your daily activities. Posture is part of body mechanics. Good posture and healthy body mechanics can help to relieve stress in your body's tissues and joints. Good posture means that your spine is in its natural S-curve position (your spine is neutral), your shoulders are pulled back slightly, and your head is not tipped forward. The following are general guidelines for applying improved posture and body mechanics to your everyday activities. Standing   When standing, keep your spine neutral and your feet about hip-width apart. Keep a slight bend in your knees. Your ears, shoulders, and hips should line up.  When you do a task in which you stand in one place for a long time, place one foot up on a stable object that is 2-4 inches (5-10 cm) high, such as a footstool. This helps keep your spine neutral. Sitting   When sitting, keep your spine neutral and keep your feet flat on the floor. Use a footrest, if necessary, and keep your thighs parallel to the floor. Avoid rounding your shoulders, and avoid tilting your head forward.  When working at a desk or a computer, keep your desk at a height where your hands are  slightly lower than your elbows. Slide your chair under your desk so you are close enough to maintain good posture.  When working at a computer, place your monitor at a height where you are looking straight ahead and you do not have to tilt your head forward or downward to look at the screen. Resting   When lying down and resting, avoid positions that are most painful for you.  If you have pain with activities such as sitting, bending, stooping, or squatting (flexion-based activities), lie in a position in which your body does not bend very much. For example, avoid curling up on your side with your arms and knees near your chest (fetal position).  If you have pain with activities such as standing for a long time or reaching with your arms (extension-based activities), lie with your spine in a neutral position and bend your knees slightly. Try the following positions: ? Lying on your side with a pillow between your knees. ? Lying on your back with a pillow under your knees. Lifting   When lifting objects, keep your feet at least shoulder-width apart and tighten your abdominal muscles.  Bend your knees and hips and keep your spine neutral. It is important to lift using the strength of your legs, not your back. Do not lock your knees straight out.  Always ask for help to lift heavy or awkward objects. This information is not intended to replace advice given to you by your health care provider. Make sure you discuss any questions you have with your health care provider. Document Released: 05/22/2005 Document Revised: 01/27/2016 Document Reviewed: 02/05/2015 Elsevier Interactive Patient Education  2019 [employer]  Preventing Diabetes Mellitus Complications You can take action to prevent or slow down problems that are caused by diabetes (diabetes mellitus). Following your diabetes plan and taking care of yourself can reduce your risk of serious or life-threatening complications. What actions  can I take to prevent diabetes complications? Manage your diabetes   Follow instructions from your health care providers  about managing your diabetes. Your diabetes may be managed by a team of health care providers who can teach you how to care for yourself and can answer questions that you have.  Educate yourself about your condition so you can make healthy choices about eating and physical activity.  Check your blood sugar (glucose) levels as often as directed. Your health care provider will help you decide how often to check your blood glucose level depending on your treatment goals and how well you are meeting them.  Ask your health care provider if you should take low-dose aspirin daily and what dose is recommended for you. Taking low-dose aspirin daily is recommended to help prevent cardiovascular disease. Do not use nicotine or tobacco Do not use any products that contain nicotine or tobacco, such as cigarettes and e-cigarettes. If you need help quitting, ask your health care provider. Nicotine raises your risk for diabetes problems. If you quit using nicotine:  You will lower your risk for heart attack, stroke, nerve disease, and kidney disease.  Your cholesterol and blood pressure may improve.  Your blood circulation will improve. Keep your blood pressure under control Your personal target blood pressure is determined based on:  Your age.  Your medicines.  How long you have had diabetes.  Any other medical conditions you have. To control your blood pressure:  Follow instructions from your health care provider about meal planning, exercise, and medicines.  Make sure your health care provider checks your blood pressure at every medical visit.  Monitor your blood pressure at home as told by your health care provider.  Keep your cholesterol under control To control your cholesterol:  Follow instructions from your health care provider about meal planning, exercise, and  medicines.  Have your cholesterol checked at least once a year.  You may be prescribed medicine to lower cholesterol (statin). If you are not taking a statin, ask your health care provider if you should be. Controlling your cholesterol may:  Help prevent heart disease and stroke. These are the most common health problems for people with diabetes.  Improve your blood flow. Schedule and keep yearly physical exams and eye exams Your health care provider will tell you how often you need medical visits depending on your diabetes management plan. Keep all follow-up visits as directed. This is important so possible problems can be identified early and complications can be avoided or treated.  Every visit with your health care provider should include measuring your: ? Weight. ? Blood pressure. ? Blood glucose control.  Your A1c (hemoglobin A1c) level should be checked: ? At least 2 times a year, if you are meeting your treatment goals. ? 4 times a year, if you are not meeting treatment goals or if your treatment goals have changed.  Your blood lipids (lipid profile) should be checked yearly. You should also be checked yearly for protein in your urine (urine microalbumin).  If you have type 1 diabetes, get an eye exam 3-5 years after you are diagnosed, and then once a year after your first exam.  If you have type 2 diabetes, get an eye exam as soon as you are diagnosed, and then once a year after your first exam. Keep your vaccines current It is recommended that you receive:  A flu (influenza) vaccine every year.  A pneumonia (pneumococcal) vaccine and a hepatitis B vaccine. If you are age 53 or older, you may get the pneumonia vaccine as a series of two separate shots. Ask your  health care provider which other vaccines may be recommended. Take care of your feet Diabetes may cause you to have poor blood circulation to your legs and feet. Because of this, taking care of your feet is very  important. Diabetes can cause:  The skin on the feet to get thinner, break more easily, and heal more slowly.  Nerve damage in your legs and feet, which results in decreased feeling. You may not notice minor injuries that could lead to serious problems. To avoid foot problems:  Check your skin and feet every day for cuts, bruises, redness, blisters, or sores.  Schedule a foot exam with your health care provider once every year. This exam includes: ? Inspecting of the structure and skin of your feet. ? Checking the pulses and sensation in your feet.  Make sure that your health care provider performs a visual foot exam at every medical visit.  Take care of your teeth People with poorly controlled diabetes are more likely to have gum (periodontal) disease. Diabetes can make periodontal diseases harder to control. If not treated, periodontal diseases can lead to tooth loss. To prevent this:  Brush your teeth twice a day.  Floss at least once a day.  Visit your dentist 2 times a year. Drink responsibly Limit alcohol intake to no more than 1 drink a day for nonpregnant women and 2 drinks a day for men. One drink equals 12 oz of beer, 5 oz of wine, or 1 oz of hard liquor.  It is important to eat food when you drink alcohol to avoid low blood glucose (hypoglycemia). Avoid alcohol if you:  Have a history of alcohol abuse or dependence.  Are pregnant.  Have liver disease, pancreatitis, advanced neuropathy, or severe hypertriglyceridemia. Lessen stress Living with diabetes can be stressful. When you are experiencing stress, your blood glucose may be affected in two ways:  Stress hormones may cause your blood glucose to rise.  You may be distracted from taking good care of yourself. Be aware of your stress level and make changes to help you manage challenging situations. To lower your stress levels:  Consider joining a support group.  Do planned relaxation or meditation.  Do a hobby  that you enjoy.  Maintain healthy relationships.  Exercise regularly.  Work with your health care provider or a mental health professional. Summary  You can take action to prevent or slow down problems that are caused by diabetes (diabetes mellitus). Following your diabetes plan and taking care of yourself can reduce your risk of serious or life-threatening complications.  Follow instructions from your health care providers about managing your diabetes. Your diabetes may be managed by a team of health care providers who can teach you how to care for yourself and can answer questions that you have.  Your health care provider will tell you how often you need medical visits depending on your diabetes management plan. Keep all follow-up visits as directed. This is important so possible problems can be identified early and complications can be avoided or treated. This information is not intended to replace advice given to you by your health care provider. Make sure you discuss any questions you have with your health care provider. Document Released: 02/07/2011 Document Revised: 01/09/2017 Document Reviewed: 02/19/2016 [employer] Interactive Patient Education  2019 [employer]  Alcohol Use Disorder Alcohol use disorder is when your drinking disrupts your daily life. When you have this condition, you drink too much alcohol and you cannot control your drinking. Alcohol  use disorder can cause serious problems with your physical health. It can affect your brain, heart, liver, pancreas, immune system, stomach, and intestines. Alcohol use disorder can increase your risk for certain cancers and cause problems with your mental health, such as depression, anxiety, psychosis, delirium, and dementia. People with this disorder risk hurting themselves and others. What are the causes? This condition is caused by drinking too much alcohol over time. It is not caused by drinking too much alcohol only one or two  times. Some people with this condition drink alcohol to cope with or escape from negative life events. Others drink to relieve pain or symptoms of mental illness. What increases the risk? You are more likely to develop this condition if:  You have a family history of alcohol use disorder.  Your culture encourages drinking to the point of intoxication, or makes alcohol easy to get.  You had a mood or conduct disorder in childhood.  You have been a victim of abuse.  You are an adolescent and: ? You have poor grades or difficulties in school. ? Your caregivers do not talk to you about saying no to alcohol, or supervise your activities. ? You are impulsive or you have trouble with self-control. What are the signs or symptoms? Symptoms of this condition include:  Drinkingmore than you want to.  Drinking for longer than you want to.  Trying several times to drink less or to control your drinking.  Spending a lot of time getting alcohol, drinking, or recovering from drinking.  Craving alcohol.  Having problems at work, at school, or at home due to drinking.  Having problems in relationships due to drinking.  Drinking when it is dangerous to drink, such as before driving a car.  Continuing to drink even though you know you might have a physical or mental problem related to drinking.  Needing more and more alcohol to get the same effect you want from the alcohol (building up tolerance).  Having symptoms of withdrawal when you stop drinking. Symptoms of withdrawal include: ? Fatigue. ? Nightmares. ? Trouble sleeping. ? Depression. ? Anxiety. ? Fever. ? Seizures. ? Severe confusion. ? Feeling or seeing things that are not there (hallucinations). ? Tremors. ? Rapid heart rate. ? Rapid breathing. ? High blood pressure.  Drinking to avoid symptoms of withdrawal. How is this diagnosed? This condition is diagnosed with an assessment. Your health care provider may start the  assessment by asking three or four questions about your drinking. Your health care provider may perform a physical exam or do lab tests to see if you have physical problems resulting from alcohol use. She or he may refer you to a mental health professional for evaluation. How is this treated? Some people with alcohol use disorder are able to reduce their alcohol use to low-risk levels. Others need to completely quit drinking alcohol. When necessary, mental health professionals with specialized training in substance use treatment can help. Your health care provider can help you decide how severe your alcohol use disorder is and what type of treatment you need. The following forms of treatment are available:  Detoxification. Detoxification involves quitting drinking and using prescription medicines within the first week to help lessen withdrawal symptoms. This treatment is important for people who have had withdrawal symptoms before and for heavy drinkers who are likely to have withdrawal symptoms. Alcohol withdrawal can be dangerous, and in severe cases, it can cause death. Detoxification may be provided in a home, community, or primary  care setting, or in a hospital or substance use treatment facility.  Counseling. This treatment is also called talk therapy. It is provided by substance use treatment counselors. A counselor can address the reasons you use alcohol and suggest ways to keep you from drinking again or to prevent problem drinking. The goals of talk therapy are to: ? Find healthy activities and ways for you to cope with stress. ? Identify and avoid the things that trigger your alcohol use. ? Help you learn how to handle cravings.  Medicines.Medicines can help treat alcohol use disorder by: ? Decreasing alcohol cravings. ? Decreasing the positive feeling you have when you drink alcohol. ? Causing an uncomfortable physical reaction when you drink alcohol (aversion therapy).  Support groups.  Support groups are led by people who have quit drinking. They provide emotional support, advice, and guidance. These forms of treatment are often combined. Some people with this condition benefit from a combination of treatments provided by specialized substance use treatment centers. Follow these instructions at home:  Take over-the-counter and prescription medicines only as told by your health care provider.  Check with your health care provider before starting any new medicines.  Ask friends and family members not to offer you alcohol.  Avoid situations where alcohol is served, including gatherings where others are drinking alcohol.  Create a plan for what to do when you are tempted to use alcohol.  Find hobbies or activities that you enjoy that do not include alcohol.  Keep all follow-up visits as told by your health care provider. This is important. How is this prevented?  If you drink, limit alcohol intake to no more than 1 drink a day for nonpregnant women and 2 drinks a day for men. One drink equals 12 oz of beer, 5 oz of wine, or 1 oz of hard liquor.  If you have a mental health condition, get treatment and support.  Do not give alcohol to adolescents.  If you are an adolescent: ? Do not drink alcohol. ? Do not be afraid to say no if someone offers you alcohol. Speak up about why you do not want to drink. You can be a positive role model for your friends and set a good example for those around you by not drinking alcohol. ? If your friends drink, spend time with others who do not drink alcohol. Make new friends who do not use alcohol. ? Find healthy ways to manage stress and emotions, such as meditation or deep breathing, exercise, spending time in nature, listening to music, or talking with a trusted friend or family member. Contact a health care provider if:  You are not able to take your medicines as told.  Your symptoms get worse.  You return to drinking alcohol  (relapse) and your symptoms get worse. Get help right away if:  You have thoughts about hurting yourself or others. If you ever feel like you may hurt yourself or others, or have thoughts about taking your own life, get help right away. You can go to your nearest emergency department or call:  Your local emergency services (911 in the [place]  A suicide crisis helpline, such as the National Suicide Prevention Lifeline at 253-170-4589. This is open 24 hours a day. Summary  Alcohol use disorder is when your drinking disrupts your daily life. When you have this condition, you drink too much alcohol and you cannot control your drinking.  Treatment may include detoxification, counseling, medicine, and support groups.  Ask friends  and family members not to offer you alcohol. Avoid situations where alcohol is served.  Get help right away if you have thoughts about hurting yourself or others. This information is not intended to replace advice given to you by your health care provider. Make sure you discuss any questions you have with your health care provider. Document Released: 06/29/2004 Document Revised: 02/17/2016 Document Reviewed: 02/17/2016 Elsevier Interactive Patient Education  2019 [employer].  Steps to Quit Smoking  Smoking tobacco can be bad for your health. It can also affect almost every organ in your body. Smoking puts you and people around you at risk for many serious long-lasting (chronic) diseases. Quitting smoking is hard, but it is one of the best things that you can do for your health. It is never too late to quit. What are the benefits of quitting smoking? When you quit smoking, you lower your risk for getting serious diseases and conditions. They can include:  Lung cancer or lung disease.  Heart disease.  Stroke.  Heart attack.  Not being able to have children (infertility).  Weak bones (osteoporosis) and broken bones (fractures). If you have coughing,  wheezing, and shortness of breath, those symptoms may get better when you quit. You may also get sick less often. If you are pregnant, quitting smoking can help to lower your chances of having a baby of low birth weight. What can I do to help me quit smoking? Talk with your doctor about what can help you quit smoking. Some things you can do (strategies) include:  Quitting smoking totally, instead of slowly cutting back how much you smoke over a period of time.  Going to in-person counseling. You are more likely to quit if you go to many counseling sessions.  Using resources and support systems, such as: ? Database administrator with a Social worker. ? Phone quitlines. ? Careers information officer. ? Support groups or group counseling. ? Text messaging programs. ? Mobile phone apps or applications.  Taking medicines. Some of these medicines may have nicotine in them. If you are pregnant or breastfeeding, do not take any medicines to quit smoking unless your doctor says it is okay. Talk with your doctor about counseling or other things that can help you. Talk with your doctor about using more than one strategy at the same time, such as taking medicines while you are also going to in-person counseling. This can help make quitting easier. What things can I do to make it easier to quit? Quitting smoking might feel very hard at first, but there is a lot that you can do to make it easier. Take these steps:  Talk to your family and friends. Ask them to support and encourage you.  Call phone quitlines, reach out to support groups, or work with a Social worker.  Ask people who smoke to not smoke around you.  Avoid places that make you want (trigger) to smoke, such as: ? Bars. ? Parties. ? Smoke-break areas at work.  Spend time with people who do not smoke.  Lower the stress in your life. Stress can make you want to smoke. Try these things to help your stress: ? Getting regular exercise. ? Deep-breathing  exercises. ? Yoga. ? Meditating. ? Doing a body scan. To do this, close your eyes, focus on one area of your body at a time from head to toe, and notice which parts of your body are tense. Try to relax the muscles in those areas.  Download or buy apps on  your mobile phone or tablet that can help you stick to your quit plan. There are many free apps, such as QuitGuide from the [employer] developer for Disease Control and Prevention). You can find more support from smokefree.gov and other websites. This information is not intended to replace advice given to you by your health care provider. Make sure you discuss any questions you have with your health care provider. Document Released: 03/18/2009 Document Revised: 01/18/2016 Document Reviewed: 10/06/2014 Elsevier Interactive Patient Education  2019 [employer].

## 2018-11-27 NOTE — Progress Notes (Signed)
Patient presents to clinic today to establish care and f/u on chronic issues.  SUBJECTIVE: PMH: Pt is a 53 yo male with pmh sig for DM II and recurrent sciatica.  Pt previously seen at [facility].  Sciatica: -Right sided x1.5 months -pain/numbness in middle of butt, down back of R leg.  -Occasional numbness in R toe -denies loss of bowel or bladder, fever, back pain -Occurs with prolonged standing while at work -seen by UC, given Robaxin 500 mg and meloxicam 7.5 mg -In the past was given steroids and steroid injection which caused hyper glycemia -Pt stretches and walks to try to relieve pain -States elevation and muscle relaxers help -Difficult to sleep at night  DM2: -Taking metformin XR 500 mg daily -States FSBS elevated, 180s-200. -was on novolin 70/30 briefly in the past -unsure of last Hgb A1C -had hyperglycemia after taking prednisone for sciatica -Eating burgers, fried foods, chicken, drinking 1-2 sodas per day, drinking 6 beers per day, 3 bottles of water  Alcohol use: -drinking a six-pack per day -Has been doing this for years -Denies h/o liver disease -Endorses family h/o alcoholism (twin brother)  Tobacco use: -smoking 3 cigarettes/day times the last 10 years -considering quitting  Allergies: NKDA  Past surgical history: None  Social history: Patient endorses tobacco and alcohol use.  Patient denies drug use.  Family medical history: Mom-alive, 37 yo bladder cancer, alcohol use, tobacco use Dad-diabetes type 2 Twin brother-"in a home" 2/2 alcohol abuse  Past Medical History:  Diagnosis Date  . Diabetes mellitus without complication ([place])   . Hypertension   . Peptic ulcer     History reviewed. No pertinent surgical history.  Current Outpatient Medications on File Prior to Visit  Medication Sig Dispense Refill  . acetaminophen (TYLENOL) 500 MG tablet Take 500-1,000 mg by mouth every 6 (six) hours as needed for mild pain, moderate pain, fever or  headache.    . glucose blood test strip Use as instructed 100 each 12  . glucose monitoring kit (FREESTYLE) monitoring kit 1 each by Does not apply route as needed for other. 1 each 2  . insulin NPH-regular Human (NOVOLIN 70/30) (70-30) 100 UNIT/ML injection 25 units in the morning and 25 units in the evening 10 mL 12  . meloxicam (MOBIC) 7.5 MG tablet Take 1 tablet (7.5 mg total) by mouth daily. 20 tablet 0  . metFORMIN (GLUMETZA) 500 MG (MOD) 24 hr tablet Take 1 tablet (500 mg total) by mouth daily with breakfast. 30 tablet 0  . methocarbamol (ROBAXIN) 500 MG tablet Take 1 tablet (500 mg total) by mouth every 8 (eight) hours as needed for muscle spasms. 15 tablet 0  . methylPREDNISolone (MEDROL DOSEPAK) 4 MG TBPK tablet Per box instructions 21 tablet 0   No current facility-administered medications on file prior to visit.     No Known Allergies  Family History  Problem Relation Age of Onset  . Diabetes Father     Social History   Socioeconomic History  . Marital status: Single    Spouse name: Not on file  . Number of children: Not on file  . Years of education: Not on file  . Highest education level: Not on file  Occupational History  . Not on file  Social Needs  . Financial resource strain: Not on file  . Food insecurity    Worry: Not on file    Inability: Not on file  . Transportation needs    Medical: Not on file  Non-medical: Not on file  Tobacco Use  . Smoking status: Current Every Day Smoker    Types: Cigarettes  . Smokeless tobacco: Never Used  . Tobacco comment: 2  a day  Substance and Sexual Activity  . Alcohol use: Yes    Alcohol/week: 42.0 standard drinks    Types: 42 Cans of beer per week    Comment: 6 pack a day  . Drug use: No  . Sexual activity: Not on file  Lifestyle  . Physical activity    Days per week: Not on file    Minutes per session: Not on file  . Stress: Not on file  Relationships  . Social Herbalist on phone: Not on file     Gets together: Not on file    Attends religious service: Not on file    Active member of club or organization: Not on file    Attends meetings of clubs or organizations: Not on file    Relationship status: Not on file  . Intimate partner violence    Fear of current or ex partner: Not on file    Emotionally abused: Not on file    Physically abused: Not on file    Forced sexual activity: Not on file  Other Topics Concern  . Not on file  Social History Narrative  . Not on file    ROS General: Denies fever, chills, night sweats, changes in weight, changes in appetite HEENT: Denies headaches, ear pain, changes in vision, rhinorrhea, sore throat CV: Denies CP, palpitations, SOB, orthopnea Pulm: Denies SOB, cough, wheezing GI: Denies abdominal pain, nausea, vomiting, diarrhea, constipation GU: Denies dysuria, hematuria, frequency, vaginal discharge Msk: Denies muscle cramps, joint pains Neuro: Denies weakness, numbness, tingling  +R sided sciatic pain Skin: Denies rashes, bruising Psych: Denies depression, anxiety, hallucinations   BP 118/90 (BP Location: Left Arm, Patient Position: Sitting, Cuff Size: Normal)   Pulse 84   Temp 98.4 F (36.9 C) (Oral)   Ht 6' (1.829 m)   Wt 144 lb (65.3 kg)   SpO2 96%   BMI 19.53 kg/m   Physical Exam Gen. Pleasant, well developed, well-nourished, in NAD HEENT - [place]/AT, PERRL, no scleral icterus, no nasal drainage Lungs: no use of accessory muscles, CTAB, no wheezes, rales or rhonchi Cardiovascular: RRR, No r/g/m, no peripheral edema Abdomen: BS present, soft, nontender,nondistended Musculoskeletal: TTP of R sided low back with radiation into posterior leg. No deformities, moves all four extremities, no cyanosis or clubbing, normal tone Neuro:  A&Ox3, CN II-XII intact, normal gait Skin:  Warm, dry, intact, no lesions   Recent Results (from the past 2160 hour(s))  CBG monitoring, ED     Status: Abnormal   Collection Time: 11/10/18 11:56  AM  Result Value Ref Range   Glucose-Capillary 300 (H) 70 - 99 mg/dL  CBG monitoring, ED     Status: Abnormal   Collection Time: 11/10/18  1:07 PM  Result Value Ref Range   Glucose-Capillary 209 (H) 70 - 99 mg/dL    Assessment/Plan: Cigarette nicotine dependence without complication  -smoking cessation counseling >3 min, <10 mins -smoking 3 cigs per day -discussed cutting down -pt to consider options to help him quti -given 1800-QUIT NOW info  Sciatica of right side  -discussed stretching, massage, etc - Plan: methocarbamol (ROBAXIN) 500 MG tablet  Type 2 diabetes mellitus with other specified complication, without long-term current use of insulin (HCC)  -continue Metfomin ER 500 mg -no longer taking novolin 70/30  will remove from med list - Plan: Lipid panel, POCT glycosylated hemoglobin (Hb A1C)  ETOH abuse  -discussed cutting down - Plan: Comprehensive metabolic panel, CBC (no diff)  Encounter to establish care -We reviewed the PMH, PSH, FH, SH, Meds and Allergies. -We provided refills for any medications we will prescribe as needed. -We addressed current concerns per orders and patient instructions. -We have asked for records for pertinent exams, studies, vaccines and notes from previous providers. -We have advised patient to follow up per instructions below.   F/u prn  Grier Mitts, MD

## 2018-11-29 ENCOUNTER — Encounter: Payer: Self-pay | Admitting: Family Medicine

## 2018-12-03 ENCOUNTER — Telehealth: Payer: Self-pay | Admitting: Family Medicine

## 2018-12-03 NOTE — Telephone Encounter (Signed)
REFILL meloxicam (MOBIC) 7.5 MG tablet  [place] [address] Barbara Cower [phone] (Phone) [phone] (Fax)   Patient call back # [phone]  Patient is requesting a call back if med is approved.

## 2018-12-04 NOTE — Telephone Encounter (Signed)
Rx was given at the ED, pt had a visit on 11/27/2018, requesting refills on Mobic,ok to send

## 2018-12-05 ENCOUNTER — Other Ambulatory Visit: Payer: Self-pay

## 2018-12-05 MED ORDER — MELOXICAM 7.5 MG PO TABS: ORAL_TABLET | ORAL | 0 refills | Status: DC

## 2018-12-05 NOTE — Telephone Encounter (Signed)
Ok to refill Mobic 7.5 mg.  Take one tab prn.  Disp 30.  0 refills.

## 2018-12-05 NOTE — Telephone Encounter (Signed)
Rx changed and sent for refill

## 2019-04-11 ENCOUNTER — Other Ambulatory Visit: Payer: Self-pay | Admitting: Family Medicine

## 2019-04-11 DIAGNOSIS — M5431 Sciatica, right side: Secondary | ICD-10-CM

## 2019-04-11 MED FILL — TRUE METRIX GLUCOSE TEST ST: 25 days supply | Qty: 100 | Fill #1

## 2019-04-11 NOTE — Telephone Encounter (Signed)
Pt LOV was on 6/24 and last refill was done the same day, please advise

## 2019-04-15 MED FILL — METHOCARBAMOL 500 MG TABS: 500 | 20 days supply | Qty: 60 | Fill #0

## 2019-04-29 ENCOUNTER — Other Ambulatory Visit: Payer: Self-pay

## 2019-04-29 ENCOUNTER — Encounter (HOSPITAL_COMMUNITY): Payer: Self-pay

## 2019-04-29 ENCOUNTER — Ambulatory Visit (HOSPITAL_COMMUNITY)
Admission: EM | Admit: 2019-04-29 | Discharge: 2019-04-29 | Disposition: A | Discharge status: Home / Self Care | Payer: Self-pay | Attending: Family Medicine | Admitting: Family Medicine

## 2019-04-29 DIAGNOSIS — K219 Gastro-esophageal reflux disease without esophagitis: Secondary | ICD-10-CM

## 2019-04-29 DIAGNOSIS — R1013 Epigastric pain: Secondary | ICD-10-CM

## 2019-04-29 MED ORDER — OMEPRAZOLE 40 MG PO CPDR: 40.0000 mg | DELAYED_RELEASE_CAPSULE | Freq: Every day | ORAL | 0 refills | Status: DC

## 2019-04-29 MED ORDER — OXYCODONE-ACETAMINOPHEN 5-325 MG PO TABS: 1.0000 | ORAL_TABLET | Freq: Four times a day (QID) | ORAL | 0 refills | Status: DC | PRN

## 2019-04-29 NOTE — ED Triage Notes (Signed)
Pt states he has a stomach ulcers and acid reflux. Pt states it's very pain. This has been going on for 3 days.

## 2019-04-29 NOTE — Discharge Instructions (Signed)
Take the omeprazole every day May use Tums or antacids in addition Bland diet until your abdominal pain improves Reduce smoking, reduce alcohol You may take pain medications as needed for severe pain.  Do not take pain pills and drive.  Do not take pain pills with alcohol Follow-up with your primary care doctor

## 2019-04-29 NOTE — ED Provider Notes (Signed)
[place]    CSN: [id number] Arrival date & time: 04/29/19  1638      History   Chief Complaint Chief Complaint  Patient presents with  . stomach ulcers    HPI John Howell is a 53 y.o. male.   HPI  Patient is 89.  Diabetic.  Controlled on Metformin.  He states he is controlled.  Last A1c was actually 8. He smokes cigarettes.  He drinks alcohol.  He has had a history of ulcers.  He does not take NSAID medicines or aspirin. He is here for upper abdominal pain.  He has been taking Tums.  He states he started on an unknown "acid reducer".  In spite of this he still has pain. He is requesting pain pills.  I explained to him that this is not appropriate management of ulcers, GERD, or stomach pain.  I told him that I will manage his stomach pain with medications, diet, behavior modification with just a few pain pills to last him until he can get this under control. I did check the [facility] narcotic database and his score is 0. He has not had an endoscopy.  Past Medical History:  Diagnosis Date  . Diabetes mellitus without complication ([place])   . Hypertension   . Peptic ulcer     There are no active problems to display for this patient.   History reviewed. No pertinent surgical history.     Home Medications    Prior to Admission medications   Medication Sig Start Date End Date Taking? Authorizing Provider  acetaminophen (TYLENOL) 500 MG tablet Take 500-1,000 mg by mouth every 6 (six) hours as needed for mild pain, moderate pain, fever or headache.    [provider]  glucose blood test strip Use as instructed 11/10/18   Domenic Moras, PA-C  glucose monitoring kit (FREESTYLE) monitoring kit 1 each by Does not apply route as needed for other. 11/10/18   Domenic Moras, PA-C  metFORMIN (GLUMETZA) 500 MG (MOD) 24 hr tablet Take 1 tablet (500 mg total) by mouth daily with breakfast. 11/10/18   Domenic Moras, PA-C  omeprazole (PRILOSEC) 40 MG capsule Take 1  capsule (40 mg total) by mouth daily. 04/29/19   Raylene Everts, MD  oxyCODONE-acetaminophen (PERCOCET/ROXICET) 5-325 MG tablet Take 1-2 tablets by mouth every 6 (six) hours as needed for severe pain. 04/29/19   Raylene Everts, MD    Family History Family History  Problem Relation Age of Onset  . Diabetes Father   . Cancer Mother     Social History Social History   Tobacco Use  . Smoking status: Current Every Day Smoker    Types: Cigarettes  . Smokeless tobacco: Never Used  . Tobacco comment: 2  a day  Substance Use Topics  . Alcohol use: Yes    Alcohol/week: 42.0 standard drinks    Types: 42 Cans of beer per week    Comment: 6 pack a day  . Drug use: No     Allergies   Patient has no known allergies.   Review of Systems Review of Systems  Constitutional: Negative for activity change, appetite change, chills and fever.  HENT: Negative for ear pain and sore throat.   Eyes: Negative for pain and visual disturbance.  Respiratory: Negative for cough and shortness of breath.   Cardiovascular: Negative for chest pain and palpitations.  Gastrointestinal: Positive for abdominal pain. Negative for vomiting.  Genitourinary: Negative for dysuria and hematuria.  Musculoskeletal: Negative  for arthralgias and back pain.  Skin: Negative for color change and rash.  Neurological: Negative for seizures and syncope.  All other systems reviewed and are negative.    Physical Exam Triage Vital Signs ED Triage Vitals  Enc Vitals Group     BP 04/29/19 1729 126/86     Pulse Rate 04/29/19 1729 71     Resp 04/29/19 1729 18     Temp 04/29/19 1729 98.6 F (37 C)     Temp src --      SpO2 04/29/19 1729 100 %     Weight 04/29/19 1727 155 lb (70.3 kg)     Height --      Head Circumference --      Peak Flow --      Pain Score 04/29/19 1727 9     Pain Loc --      Pain Edu? --      Excl. in [place]? --    No data found.  Updated Vital Signs BP 126/86 (BP Location: Right Arm)    Pulse 71   Temp 98.6 F (37 C)   Resp 18   Wt 70.3 kg   SpO2 100%   BMI 21.02 kg/m   Physical Exam Constitutional:      General: He is not in acute distress.    Appearance: He is well-developed.     Comments: Thin.  No acute distress  HENT:     Head: Normocephalic and atraumatic.     Mouth/Throat:     Comments: Mask in place Eyes:     Conjunctiva/sclera: Conjunctivae normal.     Pupils: Pupils are equal, round, and reactive to light.  Neck:     Musculoskeletal: Normal range of motion.  Cardiovascular:     Rate and Rhythm: Normal rate and regular rhythm.     Heart sounds: Normal heart sounds.  Pulmonary:     Effort: Pulmonary effort is normal. No respiratory distress.     Breath sounds: Normal breath sounds.  Abdominal:     General: Abdomen is flat. There is no distension.     Palpations: Abdomen is soft.     Tenderness: There is abdominal tenderness.     Comments: Moderate midepigastric tenderness.  Right upper quadrant hepatomegaly.  No tenderness.  Musculoskeletal: Normal range of motion.  Skin:    General: Skin is warm and dry.  Neurological:     Mental Status: He is alert.      UC Treatments / Results  Labs (all labs ordered are listed, but only abnormal results are displayed) Labs Reviewed - No data to display  EKG   Radiology No results found.  Procedures Procedures (including critical care time)  Medications Ordered in UC Medications - No data to display  Initial Impression / Assessment and Plan / UC Course  I have reviewed the triage vital signs and the nursing notes.  Pertinent labs & imaging results that were available during my care of the patient were reviewed by me and considered in my medical decision making (see chart for details).     We discussed GERD.  Ulcers.  Abdominal pain. He is can take omeprazole.  Antacids in addition if needed. Bland diet. Reduce alcohol.  Reduce nicotine. Pain medicine to be used for no more than 3  days. No driving on pain medicine.  No alcohol on pain medicine. Follow-up with PCP Pain medicine will not be refilled Final Clinical Impressions(s) / UC Diagnoses   Final diagnoses:  Abdominal  pain, epigastric  Gastroesophageal reflux disease, unspecified whether esophagitis present     Discharge Instructions     Take the omeprazole every day May use Tums or antacids in addition Bland diet until your abdominal pain improves Reduce smoking, reduce alcohol You may take pain medications as needed for severe pain.  Do not take pain pills and drive.  Do not take pain pills with alcohol Follow-up with your primary care doctor   ED Prescriptions    Medication Sig Dispense Auth. Provider   omeprazole (PRILOSEC) 40 MG capsule  (Status: Discontinued) Take 1 capsule (40 mg total) by mouth daily. 30 capsule Raylene Everts, MD   oxyCODONE-acetaminophen (PERCOCET/ROXICET) 5-325 MG tablet  (Status: Discontinued) Take 1-2 tablets by mouth every 6 (six) hours as needed for severe pain. 15 tablet Raylene Everts, MD   omeprazole (PRILOSEC) 40 MG capsule Take 1 capsule (40 mg total) by mouth daily. 30 capsule Raylene Everts, MD   oxyCODONE-acetaminophen (PERCOCET/ROXICET) 5-325 MG tablet Take 1-2 tablets by mouth every 6 (six) hours as needed for severe pain. 15 tablet Raylene Everts, MD     I have reviewed the PDMP during this encounter.   Raylene Everts, MD 04/29/19 2059

## 2019-12-13 ENCOUNTER — Other Ambulatory Visit: Payer: Self-pay

## 2019-12-13 ENCOUNTER — Emergency Department (HOSPITAL_COMMUNITY): Payer: Self-pay

## 2019-12-13 ENCOUNTER — Encounter (HOSPITAL_COMMUNITY): Payer: Self-pay

## 2019-12-13 ENCOUNTER — Inpatient Hospital Stay (HOSPITAL_COMMUNITY)
Admission: EM | Admit: 2019-12-13 | Discharge: 2019-12-15 | DRG: 914 | Disposition: A | Discharge status: Home / Self Care | Payer: Self-pay | Attending: General Surgery | Admitting: General Surgery

## 2019-12-13 DIAGNOSIS — J9690 Respiratory failure, unspecified, unspecified whether with hypoxia or hypercapnia: Secondary | ICD-10-CM

## 2019-12-13 DIAGNOSIS — S21112A Laceration without foreign body of left front wall of thorax without penetration into thoracic cavity, initial encounter: Secondary | ICD-10-CM | POA: Diagnosis present

## 2019-12-13 DIAGNOSIS — Z20822 Contact with and (suspected) exposure to covid-19: Secondary | ICD-10-CM | POA: Diagnosis present

## 2019-12-13 DIAGNOSIS — Z4682 Encounter for fitting and adjustment of non-vascular catheter: Secondary | ICD-10-CM

## 2019-12-13 DIAGNOSIS — S21131A Puncture wound without foreign body of right front wall of thorax without penetration into thoracic cavity, initial encounter: Secondary | ICD-10-CM

## 2019-12-13 DIAGNOSIS — Z23 Encounter for immunization: Secondary | ICD-10-CM

## 2019-12-13 DIAGNOSIS — S270XXA Traumatic pneumothorax, initial encounter: Secondary | ICD-10-CM | POA: Diagnosis present

## 2019-12-13 DIAGNOSIS — S21311A Laceration without foreign body of right front wall of thorax with penetration into thoracic cavity, initial encounter: Principal | ICD-10-CM | POA: Diagnosis present

## 2019-12-13 DIAGNOSIS — F17210 Nicotine dependence, cigarettes, uncomplicated: Secondary | ICD-10-CM | POA: Diagnosis present

## 2019-12-13 DIAGNOSIS — E119 Type 2 diabetes mellitus without complications: Secondary | ICD-10-CM | POA: Diagnosis present

## 2019-12-13 DIAGNOSIS — T1490XA Injury, unspecified, initial encounter: Secondary | ICD-10-CM

## 2019-12-13 DIAGNOSIS — S61412A Laceration without foreign body of left hand, initial encounter: Secondary | ICD-10-CM | POA: Diagnosis present

## 2019-12-13 DIAGNOSIS — S21119A Laceration without foreign body of unspecified front wall of thorax without penetration into thoracic cavity, initial encounter: Secondary | ICD-10-CM | POA: Diagnosis present

## 2019-12-13 LAB — COMPREHENSIVE METABOLIC PANEL
ALT: 29 U/L (ref 0–44)
ALT: 29 U/L (ref 0–44)
AST: 30 U/L (ref 15–41)
AST: 33 U/L (ref 15–41)
Albumin: 3.9 g/dL (ref 3.5–5.0)
Albumin: 4 g/dL (ref 3.5–5.0)
Alkaline Phosphatase: 61 U/L (ref 38–126)
Alkaline Phosphatase: 62 U/L (ref 38–126)
Anion gap: 18 — ABNORMAL HIGH (ref 5–15)
Anion gap: 10 (ref 5–15)
BUN: 15 mg/dL (ref 6–20)
BUN: 12 mg/dL (ref 6–20)
CO2: 12 mmol/L — ABNORMAL LOW (ref 22–32)
CO2: 22 mmol/L (ref 22–32)
Calcium: 9 mg/dL (ref 8.9–10.3)
Calcium: 9 mg/dL (ref 8.9–10.3)
Chloride: 107 mmol/L (ref 98–111)
Chloride: 106 mmol/L (ref 98–111)
Creatinine, Ser: 0.97 mg/dL (ref 0.61–1.24)
Creatinine, Ser: 0.94 mg/dL (ref 0.61–1.24)
GFR calc Af Amer: >60 mL/min (ref >60)
GFR calc Af Amer: >60 mL/min (ref >60)
GFR calc non Af Amer: >60 mL/min (ref >60)
GFR calc non Af Amer: >60 mL/min (ref >60)
Glucose, Bld: 193 mg/dL — ABNORMAL HIGH (ref 70–99)
Glucose, Bld: 106 mg/dL — ABNORMAL HIGH (ref 70–99)
Potassium: 3.5 mmol/L (ref 3.5–5.1)
Potassium: 4.4 mmol/L (ref 3.5–5.1)
Sodium: 137 mmol/L (ref 135–145)
Sodium: 138 mmol/L (ref 135–145)
Total Bilirubin: 0.3 mg/dL (ref 0.3–1.2)
Total Bilirubin: 1.1 mg/dL (ref 0.3–1.2)
Total Protein: 7 g/dL (ref 6.5–8.1)
Total Protein: 7.1 g/dL (ref 6.5–8.1)

## 2019-12-13 LAB — CBC
HCT: 41.7 % (ref 39.0–52.0)
HCT: 42.4 % (ref 39.0–52.0)
Hemoglobin: 13.9 g/dL (ref 13.0–17.0)
Hemoglobin: 14.6 g/dL (ref 13.0–17.0)
MCH: 30.3 pg (ref 26.0–34.0)
MCH: 31.2 pg (ref 26.0–34.0)
MCHC: 33.3 g/dL (ref 30.0–36.0)
MCHC: 34.4 g/dL (ref 30.0–36.0)
MCV: 91 fL (ref 80.0–100.0)
MCV: 90.6 fL (ref 80.0–100.0)
Platelets: 277 10*3/uL (ref 150–400)
Platelets: 279 10*3/uL (ref 150–400)
RBC: 4.58 MIL/uL (ref 4.22–5.81)
RBC: 4.68 MIL/uL (ref 4.22–5.81)
RDW: 12.6 % (ref 11.5–15.5)
RDW: 12.8 % (ref 11.5–15.5)
WBC: 9.4 10*3/uL (ref 4.0–10.5)
WBC: 14.7 10*3/uL — ABNORMAL HIGH (ref 4.0–10.5)
nRBC: 0 % (ref 0.0–0.2)
nRBC: 0 % (ref 0.0–0.2)

## 2019-12-13 LAB — I-STAT CHEM 8, ED
BUN: 16 mg/dL (ref 6–20)
Calcium, Ion: 1.09 mmol/L — ABNORMAL LOW (ref 1.15–1.40)
Chloride: 106 mmol/L (ref 98–111)
Creatinine, Ser: 1 mg/dL (ref 0.61–1.24)
Glucose, Bld: 186 mg/dL — ABNORMAL HIGH (ref 70–99)
HCT: 46 % (ref 39.0–52.0)
Hemoglobin: 15.6 g/dL (ref 13.0–17.0)
Potassium: 3.5 mmol/L (ref 3.5–5.1)
Sodium: 138 mmol/L (ref 135–145)
TCO2: 13 mmol/L — ABNORMAL LOW (ref 22–32)

## 2019-12-13 LAB — SARS CORONAVIRUS 2 BY RT PCR (HOSPITAL ORDER, PERFORMED IN ~~LOC~~ HOSPITAL LAB): SARS Coronavirus 2: NEGATIVE

## 2019-12-13 LAB — URINALYSIS, ROUTINE W REFLEX MICROSCOPIC
Bilirubin Urine: NEGATIVE
Glucose, UA: NEGATIVE mg/dL
Hgb urine dipstick: NEGATIVE
Ketones, ur: NEGATIVE mg/dL
Leukocytes,Ua: NEGATIVE
Nitrite: NEGATIVE
Protein, ur: NEGATIVE mg/dL
Specific Gravity, Urine: 1.029 (ref 1.005–1.030)
pH: 5 (ref 5.0–8.0)

## 2019-12-13 LAB — PHOSPHORUS: Phosphorus: 4.2 mg/dL (ref 2.5–4.6)

## 2019-12-13 LAB — MAGNESIUM: Magnesium: 1.9 mg/dL (ref 1.7–2.4)

## 2019-12-13 LAB — PROTIME-INR
INR: 1 (ref 0.8–1.2)
Prothrombin Time: 13.1 seconds (ref 11.4–15.2)

## 2019-12-13 LAB — HEMOGLOBIN A1C
Hgb A1c MFr Bld: 7 % — ABNORMAL HIGH (ref 4.8–5.6)
Mean Plasma Glucose: 154.2 mg/dL

## 2019-12-13 LAB — CBG MONITORING, ED: Glucose-Capillary: 169 mg/dL — ABNORMAL HIGH (ref 70–99)

## 2019-12-13 LAB — ETHANOL: Alcohol, Ethyl (B): 39 mg/dL — ABNORMAL HIGH (ref <10)

## 2019-12-13 LAB — GLUCOSE, CAPILLARY: Glucose-Capillary: 139 mg/dL — ABNORMAL HIGH (ref 70–99)

## 2019-12-13 LAB — HIV ANTIBODY (ROUTINE TESTING W REFLEX): HIV Screen 4th Generation wRfx: NONREACTIVE

## 2019-12-13 LAB — SAMPLE TO BLOOD BANK

## 2019-12-13 LAB — LACTIC ACID, PLASMA: Lactic Acid, Venous: 6 mmol/L (ref 0.5–1.9)

## 2019-12-13 MED ORDER — LIDOCAINE HCL (PF) 1 % IJ SOLN
INTRAMUSCULAR | Status: AC
  Filled 2019-12-13: qty 30

## 2019-12-13 MED ORDER — LACTATED RINGERS IV SOLN: INTRAVENOUS | Status: DC

## 2019-12-13 MED ORDER — LORAZEPAM 1 MG PO TABS
1.0000 mg | ORAL_TABLET | ORAL | Status: DC | PRN
  Administered 2019-12-13 (×2): 1 mg via ORAL
  Filled 2019-12-13 (×2): qty 1

## 2019-12-13 MED ORDER — MORPHINE SULFATE (PF) 4 MG/ML IV SOLN
4.0000 mg | Freq: Once | INTRAVENOUS | Status: AC
  Administered 2019-12-13: 4 mg via INTRAVENOUS
  Filled 2019-12-13: qty 1

## 2019-12-13 MED ORDER — IBUPROFEN 200 MG PO TABS: 600.0000 mg | ORAL_TABLET | Freq: Four times a day (QID) | ORAL | Status: DC | PRN

## 2019-12-13 MED ORDER — INSULIN ASPART 100 UNIT/ML ~~LOC~~ SOLN
0.0000 [IU] | Freq: Three times a day (TID) | SUBCUTANEOUS | Status: DC
  Administered 2019-12-14: 5 [IU] via SUBCUTANEOUS
  Administered 2019-12-15: 2 [IU] via SUBCUTANEOUS

## 2019-12-13 MED ORDER — DOCUSATE SODIUM 100 MG PO CAPS
200.0000 mg | ORAL_CAPSULE | Freq: Two times a day (BID) | ORAL | Status: DC
  Administered 2019-12-13 – 2019-12-15 (×4): 200 mg via ORAL
  Filled 2019-12-13 (×4): qty 2

## 2019-12-13 MED ORDER — LIDOCAINE-EPINEPHRINE (PF) 2 %-1:200000 IJ SOLN
INTRAMUSCULAR | Status: AC
  Administered 2019-12-13: 5 mL
  Filled 2019-12-13: qty 20

## 2019-12-13 MED ORDER — THIAMINE HCL 100 MG/ML IJ SOLN: 100.0000 mg | Freq: Every day | INTRAMUSCULAR | Status: DC

## 2019-12-13 MED ORDER — ACETAMINOPHEN 500 MG PO TABS
1000.0000 mg | ORAL_TABLET | Freq: Four times a day (QID) | ORAL | Status: DC
  Administered 2019-12-13 – 2019-12-15 (×8): 1000 mg via ORAL
  Filled 2019-12-13 (×8): qty 2

## 2019-12-13 MED ORDER — MORPHINE SULFATE (PF) 4 MG/ML IV SOLN
4.0000 mg | Freq: Once | INTRAVENOUS | Status: AC
  Administered 2019-12-13: 4 mg via INTRAVENOUS

## 2019-12-13 MED ORDER — LIDOCAINE-EPINEPHRINE (PF) 2 %-1:200000 IJ SOLN
INTRAMUSCULAR | Status: AC
  Filled 2019-12-13: qty 20

## 2019-12-13 MED ORDER — LIDOCAINE HCL (PF) 1 % IJ SOLN
INTRAMUSCULAR | Status: AC
  Filled 2019-12-13: qty 5

## 2019-12-13 MED ORDER — LORAZEPAM 2 MG/ML IJ SOLN: 1.0000 mg | INTRAMUSCULAR | Status: DC | PRN

## 2019-12-13 MED ORDER — IOHEXOL 300 MG/ML  SOLN
75.0000 mL | Freq: Once | INTRAMUSCULAR | Status: AC | PRN
  Administered 2019-12-13: 75 mL via INTRAVENOUS

## 2019-12-13 MED ORDER — TETANUS-DIPHTH-ACELL PERTUSSIS 5-2.5-18.5 LF-MCG/0.5 IM SUSP
0.5000 mL | Freq: Once | INTRAMUSCULAR | Status: AC
  Administered 2019-12-13: 0.5 mL via INTRAMUSCULAR
  Filled 2019-12-13: qty 0.5

## 2019-12-13 MED ORDER — ONDANSETRON HCL 4 MG/2ML IJ SOLN
4.0000 mg | Freq: Four times a day (QID) | INTRAMUSCULAR | Status: DC | PRN
  Administered 2019-12-13: 4 mg via INTRAVENOUS
  Filled 2019-12-13: qty 2

## 2019-12-13 MED ORDER — FOLIC ACID 1 MG PO TABS
1.0000 mg | ORAL_TABLET | Freq: Every day | ORAL | Status: DC
  Administered 2019-12-14 – 2019-12-15 (×2): 1 mg via ORAL
  Filled 2019-12-13 (×2): qty 1

## 2019-12-13 MED ORDER — ADULT MULTIVITAMIN W/MINERALS CH
1.0000 | ORAL_TABLET | Freq: Every day | ORAL | Status: DC
  Administered 2019-12-13 – 2019-12-15 (×3): 1 via ORAL
  Filled 2019-12-13 (×3): qty 1

## 2019-12-13 MED ORDER — TRAMADOL HCL 50 MG PO TABS
50.0000 mg | ORAL_TABLET | Freq: Four times a day (QID) | ORAL | Status: DC | PRN
  Administered 2019-12-13: 50 mg via ORAL
  Filled 2019-12-13: qty 1

## 2019-12-13 MED ORDER — INSULIN ASPART 100 UNIT/ML ~~LOC~~ SOLN: 0.0000 [IU] | Freq: Every day | SUBCUTANEOUS | Status: DC

## 2019-12-13 MED ORDER — THIAMINE HCL 100 MG PO TABS
100.0000 mg | ORAL_TABLET | Freq: Every day | ORAL | Status: DC
  Administered 2019-12-13 – 2019-12-15 (×3): 100 mg via ORAL
  Filled 2019-12-13 (×3): qty 1

## 2019-12-13 MED ORDER — ONDANSETRON 4 MG PO TBDP: 4.0000 mg | ORAL_TABLET | Freq: Four times a day (QID) | ORAL | Status: DC | PRN

## 2019-12-13 MED ORDER — HYDROMORPHONE HCL 1 MG/ML IJ SOLN
INTRAMUSCULAR | Status: AC
  Administered 2019-12-13: 0.5 mg via INTRAVENOUS
  Filled 2019-12-13: qty 1

## 2019-12-13 MED ORDER — HYDROMORPHONE HCL 1 MG/ML IJ SOLN
0.5000 mg | INTRAMUSCULAR | Status: DC | PRN
  Administered 2019-12-13 – 2019-12-14 (×2): 0.5 mg via INTRAVENOUS
  Filled 2019-12-13: qty 0.5
  Filled 2019-12-13: qty 1

## 2019-12-13 MED ORDER — LIDOCAINE-EPINEPHRINE (PF) 2 %-1:200000 IJ SOLN
20.0000 mL | Freq: Once | INTRAMUSCULAR | Status: AC
  Administered 2019-12-13: 20 mL via INTRADERMAL
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
## 2019-12-13 NOTE — Consult Note (Signed)
Responded to page, no family present, pt unavailable. Later checked back and prayed with pt, which he appreciated. Pt is [place]. Advised pt that while here he can ask a nurse to page for a chaplain at any time.  Rev. Donnel Saxon Chaplain

## 2019-12-13 NOTE — Plan of Care (Signed)

## 2019-12-13 NOTE — ED Notes (Signed)
Lunch Tray Ordered @ 1039. 

## 2019-12-14 ENCOUNTER — Inpatient Hospital Stay (HOSPITAL_COMMUNITY): Payer: Self-pay

## 2019-12-14 LAB — MRSA PCR SCREENING: MRSA by PCR: NEGATIVE

## 2019-12-14 LAB — CBC
HCT: 38.6 % — ABNORMAL LOW (ref 39.0–52.0)
Hemoglobin: 13.1 g/dL (ref 13.0–17.0)
MCH: 31.2 pg (ref 26.0–34.0)
MCHC: 33.9 g/dL (ref 30.0–36.0)
MCV: 91.9 fL (ref 80.0–100.0)
Platelets: 249 10*3/uL (ref 150–400)
RBC: 4.2 MIL/uL — ABNORMAL LOW (ref 4.22–5.81)
RDW: 12.9 % (ref 11.5–15.5)
WBC: 10 10*3/uL (ref 4.0–10.5)
nRBC: 0 % (ref 0.0–0.2)

## 2019-12-14 LAB — GLUCOSE, CAPILLARY
Glucose-Capillary: 202 mg/dL — ABNORMAL HIGH (ref 70–99)
Glucose-Capillary: 109 mg/dL — ABNORMAL HIGH (ref 70–99)
Glucose-Capillary: 118 mg/dL — ABNORMAL HIGH (ref 70–99)
Glucose-Capillary: 199 mg/dL — ABNORMAL HIGH (ref 70–99)

## 2019-12-14 MED ORDER — METHOCARBAMOL 500 MG PO TABS
1000.0000 mg | ORAL_TABLET | Freq: Three times a day (TID) | ORAL | Status: DC
  Administered 2019-12-14 – 2019-12-15 (×4): 1000 mg via ORAL
  Filled 2019-12-14 (×4): qty 2

## 2019-12-14 MED ORDER — HYDROMORPHONE HCL 1 MG/ML IJ SOLN: 0.2500 mg | Freq: Four times a day (QID) | INTRAMUSCULAR | Status: DC | PRN

## 2019-12-14 MED ORDER — OXYCODONE HCL 5 MG/5ML PO SOLN: 5.0000 mg | ORAL | Status: DC | PRN

## 2019-12-14 MED ORDER — KETOROLAC TROMETHAMINE 15 MG/ML IJ SOLN
30.0000 mg | Freq: Four times a day (QID) | INTRAMUSCULAR | Status: DC
  Administered 2019-12-14 – 2019-12-15 (×5): 30 mg via INTRAVENOUS
  Filled 2019-12-14 (×5): qty 2

## 2019-12-14 NOTE — Discharge Instructions (Addendum)
PNEUMOTHORAX OR HEMOTHORAX +/- RIB FRACTURES  HOME INSTRUCTIONS   1. PAIN CONTROL:  1. Pain is best controlled by a usual combination of three different methods TOGETHER:  i. Ice/Heat ii. Over the counter pain medication iii. Prescription pain medication 2. You may experience some swelling and bruising in area of broken ribs. Ice packs or heating pads (30-60 minutes up to 6 times a day) will help. Use ice for the first few days to help decrease swelling and bruising, then switch to heat to help relax tight/sore spots and speed recovery. Some people prefer to use ice alone, heat alone, alternating between ice & heat. Experiment to what works for you. Swelling and bruising can take several weeks to resolve.  3. It is helpful to take an over-the-counter pain medication regularly for the first few weeks. Choose one of the following that works best for you:  i. Naproxen (Aleve, etc) Two 220mg tabs twice a day ii. Ibuprofen (Advil, etc) Three 200mg tabs four times a day (every meal & bedtime) iii. Acetaminophen (Tylenol, etc) 500-650mg four times a day (every meal & bedtime) 4. A prescription for pain medication (such as oxycodone, hydrocodone, etc) may be given to you upon discharge. Take your pain medication as prescribed.  i. If you are having problems/concerns with the prescription medicine (does not control pain, nausea, vomiting, rash, itching, etc), please call us [phone] to see if we need to switch you to a different pain medicine that will work better for you and/or control your side effect better. ii. If you need a refill on your pain medication, please contact your pharmacy. They will contact our office to request authorization. Prescriptions will not be filled after 5 pm or on week-ends. 1. Avoid getting constipated. When taking pain medications, it is common to experience some constipation. Increasing fluid intake and taking a fiber supplement (such as Metamucil, Citrucel, FiberCon,  MiraLax, etc) 1-2 times a day regularly will usually help prevent this problem from occurring. A mild laxative (prune juice, Milk of Magnesia, MiraLax, etc) should be taken according to package directions if there are no bowel movements after 48 hours.  2. Watch out for diarrhea. If you have many loose bowel movements, simplify your diet to bland foods & liquids for a few days. Stop any stool softeners and decrease your fiber supplement. Switching to mild anti-diarrheal medications (Kayopectate, Pepto Bismol) can help. If this worsens or does not improve, please call us. 3. Chest tube site wound: you may remove the dressing from your chest tube site 3 days after the removal of your chest tube. DO NOT shower over the dressing. Once   removed, you may shower as normal. Do not submerge your wound in water for 2-3 weeks.  4. FOLLOW UP  a. Please call our office to set up or confirm an appointment for follow up for 2 weeks after discharge. You will need to get a chest xray at either [facility] or [facility]. This will be outlined in your follow up instructions. Please call [facility] at [phone] if you have any questions about follow up.  b. If you have any orthopedic or other injuries you will need to follow up as outlined in your follow up instructions.   WHEN TO CALL US [phone]:  1. Poor pain control 2. Reactions / problems with new medications (rash/itching, nausea, etc)  3. Fever over 101.5 F (38.5 C) 4. Worsening swelling or bruising 5. Redness, drainage, pain or swelling around chest   tube site 6. Worsening pain, productive cough, difficulty breathing or any other concerning symptoms  The clinic staff is available to answer your questions during regular business hours (8:30am-5pm). Please don't hesitate to call and ask to speak to one of our nurses for clinical concerns.  If you have a medical emergency, go to the nearest emergency room or call 911.  A surgeon from [facility] is always on call at the hospitals   [facility]  [address], [address], [place], [place] [address] ?  MAIN: [phone] ? TOLL FREE: [phone] ?  FAX [phone]  [email or web address]      Information on Rib Fractures  A rib fracture is a break or crack in one of the bones of the ribs. The ribs are long, curved bones that wrap around your chest and attach to your spine and your breastbone. The ribs protect your heart, lungs, and other organs in the chest. A broken or cracked rib is often painful but is not usually serious. Most rib fractures heal on their own over time. However, rib fractures can be more serious if multiple ribs are broken or if broken ribs move out of place and push against other structures or organs. What are the causes? This condition is caused by:  Repetitive movements with high force, such as pitching a baseball or having severe coughing spells.  A direct blow to the chest, such as a sports injury, a car accident, or a fall.  Cancer that has spread to the bones, which can weaken bones and cause them to break. What are the signs or symptoms? Symptoms of this condition include:  Pain when you breathe in or cough.  Pain when someone presses on the injured area.  Feeling short of breath. How is this diagnosed? This condition is diagnosed with a physical exam and medical history. Imaging tests may also be done, such as:  Chest X-ray.  CT scan.  MRI.  Bone scan.  Chest ultrasound. How is this treated? Treatment for this condition depends on the severity of the fracture. Most rib fractures usually heal on their own in 1-3 months. Sometimes healing takes longer if there is a cough that does not stop or if there are other activities that make the injury worse (aggravating factors). While you heal, you will be given medicines to control the pain. You will also be taught deep breathing  exercises. Severe injuries may require hospitalization or surgery. Follow these instructions at home: Managing pain, stiffness, and swelling  If directed, apply ice to the injured area. ? Put ice in a plastic bag. ? Place a towel between your skin and the bag. ? Leave the ice on for 20 minutes, 2-3 times a day.  Take over-the-counter and prescription medicines only as told by your health care provider. Activity  Avoid a lot of activity and any activities or movements that cause pain. Be careful during activities and avoid bumping the injured rib.  Slowly increase your activity as told by your health care provider. General instructions  Do deep breathing exercises as told by your health care provider. This helps prevent pneumonia, which is a common complication of a broken rib. Your health care provider may instruct you to: ? Take deep breaths several times a day. ? Try to cough several times a day, holding a pillow against the injured area. ? Use a device called incentive spirometer to practice deep breathing several times a day.  Drink enough   fluid to keep your urine pale yellow.  Do not wear a rib belt or binder. These restrict breathing, which can lead to pneumonia.  Keep all follow-up visits as told by your health care provider. This is important. Contact a health care provider if:  You have a fever. Get help right away if:  You have difficulty breathing or you are short of breath.  You develop a cough that does not stop, or you cough up thick or bloody sputum.  You have nausea, vomiting, or pain in your abdomen.  Your pain gets worse and medicine does not help. Summary  A rib fracture is a break or crack in one of the bones of the ribs.  A broken or cracked rib is often painful but is not usually serious.  Most rib fractures heal on their own over time.  Treatment for this condition depends on the severity of the fracture.  Avoid a lot of activity and any  activities or movements that cause pain. This information is not intended to replace advice given to you by your health care provider. Make sure you discuss any questions you have with your health care provider. Document Released: 05/22/2005 Document Revised: 08/21/2016 Document Reviewed: 08/21/2016 Elsevier Interactive Patient Education  2019 Else[employer] Inc[employer]    Pneumothorax A pneumothorax is commonly called a collapsed lung. It is a condition in which air leaks from a lung and builds up between the thin layer of tissue that covers the lungs (visceral pleura) and the interior wall of the chest cavity (parietal pleura). The air gets trapped outside the lung, between the lung and the chest wall (pleural space). The air takes up space and prevents the lung from fully expanding. This condition sometimes occurs suddenly with no apparent cause. The buildup of air may be small or large. A small pneumothorax may go away on its own. A large pneumothorax will require treatment and hospitalization. What are the causes? This condition may be caused by:  Trauma and injury to the chest wall.  Surgery and other medical procedures.  A complication of an underlying lung problem, especially chronic obstructive pulmonary disease (COPD) or emphysema. Sometimes the cause of this condition is not known. What increases the risk? You are more likely to develop this condition if:  You have an underlying lung problem.  You smoke.  You are 20-40 years old, male, tall, and underweight.  You have a personal or family history of pneumothorax.  You have an eating disorder (anorexia nervosa). This condition can also happen quickly, even in people with no history of lung problems. What are the signs or symptoms? Sometimes a pneumothorax will have no symptoms. When symptoms are present, they can include:  Chest pain.  Shortness of breath.  Increased rate of breathing.  Bluish color to your lips or skin  (cyanosis). How is this diagnosed? This condition may be diagnosed by:  A medical history and physical exam.  A chest X-ray, chest CT scan, or ultrasound. How is this treated? Treatment depends on how severe your condition is. The goal of treatment is to remove the extra air and allow your lung to expand back to its normal size.  For a small pneumothorax: ? No treatment may be needed. ? Extra oxygen is sometimes used to make it go away more quickly.  For a large pneumothorax or a pneumothorax that is causing symptoms, a procedure is done to drain the air from your lungs. To do this, a health care provider may   use: ? A needle with a syringe. This is used to suck air from a pleural space where no additional leakage is taking place. ? A chest tube. This is used to suck air where there is ongoing leakage into the pleural space. The chest tube may need to remain in place for several days until the air leak has healed.  In more severe cases, surgery may be needed to repair the damage that is causing the leak.  If you have multiple pneumothorax episodes or have an air leak that will not heal, a procedure called a pleurodesis may be done. A medicine is placed in the pleural space to irritate the tissues around the lung so that the lung will stick to the chest wall, seal any leaks, and stop any buildup of air in that space. If you have an underlying lung problem, severe symptoms, or a large pneumothorax you will usually need to stay in the hospital. Follow these instructions at home: Lifestyle  Do not use any products that contain nicotine or tobacco, such as cigarettes and e-cigarettes. These are major risk factors in pneumothorax. If you need help quitting, ask your health care provider.  Do not lift anything that is heavier than 10 lb (4.5 kg), or the limit that your health care provider tells you, until he or she says that it is safe.  Avoid activities that take a lot of effort (strenuous)  for as long as told by your health care provider.  Return to your normal activities as told by your health care provider. Ask your health care provider what activities are safe for you.  Do not fly in an airplane or scuba dive until your health care provider says it is okay. General instructions  Take over-the-counter and prescription medicines only as told by your health care provider.  If a cough or pain makes it difficult for you to sleep at night, try sleeping in a semi-upright position in a recliner or by using 2 or 3 pillows.  If you had a chest tube and it was removed, ask your health care provider when you can remove the bandage (dressing). While the dressing is in place, do not allow it to get wet.  Keep all follow-up visits as told by your health care provider. This is important. Contact a health care provider if:  You cough up thick mucus (sputum) that is yellow or green in color.  You were treated with a chest tube, and you have redness, increasing pain, or discharge at the site where it was placed. Get help right away if:  You have increasing chest pain or shortness of breath.  You have a cough that will not go away.  You begin coughing up blood.  You have pain that is getting worse or is not controlled with medicines.  The site where your chest tube was located opens up.  You feel air coming out of the site where the chest tube was placed.  You have a fever or persistent symptoms for more than 2-3 days.  You have a fever and your symptoms suddenly get worse. These symptoms may represent a serious problem that is an emergency. Do not wait to see if the symptoms will go away. Get medical help right away. Call your local emergency services (911 in the [place] Do not drive yourself to the hospital. Summary  A pneumothorax, commonly called a collapsed lung, is a condition in which air leaks from a lung and gets trapped between the   lung and the chest wall (pleural  space).  The buildup of air may be small or large. A small pneumothorax may go away on its own. A large pneumothorax will require treatment and hospitalization.  Treatment for this condition depends on how severe the pneumothorax is. The goal of treatment is to remove the extra air and allow the lung to expand back to its normal size. This information is not intended to replace advice given to you by your health care provider. Make sure you discuss any questions you have with your health care provider. Document Released: 05/22/2005 Document Revised: 04/30/2017 Document Reviewed: 04/30/2017 Elsevier Interactive Patient Education  2019 Elsevier Inc.    Laceration Care, Adult A laceration is a cut that may go through all layers of the skin and into the tissue that is right under the skin. Some lacerations heal on their own. Others need to be closed with stitches (sutures), staples, skin adhesive strips, or skin glue. Proper care of a laceration reduces the risk for infection, helps the laceration heal better, and may prevent scarring. How to care for your laceration Wash your hands with soap and water before touching your wound or changing your bandage (dressing). If soap and water are not available, use hand sanitizer. Keep the wound clean and dry. If you were given a dressing, you should change it at least once a day, or as told by your health care provider. You should also change it if it becomes wet or dirty. If sutures or staples were used:  Keep the wound completely dry for the first 24 hours, or as told by your health care provider. After that time, you may shower or bathe. However, make sure that the wound is not soaked in water until after the sutures or staples have been removed.  Clean the wound once each day, or as told by your health care provider: ? Wash the wound with soap and water. ? Rinse the wound with water to remove all soap. ? Pat the wound dry with a clean towel. Do not  rub the wound.  After cleaning the wound, apply a thin layer of antibiotic ointment as told by your health care provider. This will help prevent infection and keep the dressing from sticking to the wound.  Have the sutures or staples removed as told by your health care provider. If skin adhesive strips were used:  Do not get the skin adhesive strips wet. You may shower or bathe, but be careful to keep the wound dry.  If the wound gets wet, pat it dry with a clean towel. Do not rub the wound.  Skin adhesive strips fall off on their own. You may trim the strips as the wound heals. Do not remove skin adhesive strips that are still stuck to the wound. They will fall off in time. If skin glue was used:  Try to keep the wound dry, but you may briefly wet it in the shower or bath. Do not soak the wound in water, such as by swimming.  After you have showered or bathed, gently pat the wound dry with a clean towel. Do not rub the wound.  Do not do any activities that will make you sweat heavily until the skin glue has fallen off on its own.  Do not apply liquid, cream, or ointment medicine to the wound while the skin glue is in place. Using those may loosen the film before the wound has healed.  If a dressing is placed  over the wound, be careful not to apply tape directly over the skin glue. Doing that may cause the glue to be pulled off before the wound has healed.  Do not pick at the glue. Skin glue usually remains in place for 5-10 days and then falls off the skin. General instructions   Take over-the-counter and prescription medicines only as told by your health care provider.  If you were prescribed an antibiotic medicine or ointment, take or apply it as told by your health care provider. Do not stop using it even if your condition improves.  Do not scratch or pick at the wound.  Check your wound every day for signs of infection. Watch for: ? Redness, swelling, or pain. ? Fluid, blood,  or pus.  Raise (elevate) the injured area above the level of your heart while you are sitting or lying down for the first 24-48 hours after the laceration is repaired.  If directed, put ice on the affected area: ? Put ice in a plastic bag. ? Place a towel between your skin and the bag. ? Leave the ice on for 20 minutes, 2-3 times a day.  Keep all follow-up visits as told by your health care provider. This is important. Contact a health care provider if:  You received a tetanus shot and you have swelling, severe pain, redness, or bleeding at the injection site.  You have a fever.  A wound that was closed breaks open.  You notice a bad smell coming from your wound or your dressing.  You notice something coming out of the wound, such as wood or glass.  Your pain is not controlled with medicine.  You have increased redness, swelling, or pain at the site of your wound.  You have fluid, blood, or pus coming from your wound.  You need to change the dressing often due to fluid, blood, or pus that is draining from the wound.  You develop a new rash.  You develop numbness around the wound. Get help right away if:  You develop severe swelling around the wound.  Your pain suddenly increases and is severe.  You develop painful lumps near the wound or on skin anywhere else on your body.  You have a red streak going away from your wound.  The wound is on your hand or foot and you cannot properly move a finger or toe.  The wound is on your hand or foot, and you notice that your fingers or toes look pale or bluish. Summary  A laceration is a cut that may go through all layers of the skin and into the tissue that is right under the skin.  Some lacerations heal on their own. Others need to be closed with stitches (sutures), staples, skin adhesive strips, or skin glue.  Proper care of a laceration reduces the risk of infection, helps the laceration heal better, and prevents  scarring. This information is not intended to replace advice given to you by your health care provider. Make sure you discuss any questions you have with your health care provider. Document Revised: 07/20/2017 Document Reviewed: 06/11/2017 Elsevier Patient Education  2020 [employer] Inc.    Hematoma A hematoma is a collection of blood. A hematoma can happen:  Under the skin.  In an organ.  In a body space.  In a joint space.  In other tissues. The blood can thicken (clot) to form a lump that you can see and feel. The lump is often hard and may  become sore and tender. The lump can be very small or very big. Most hematomas get better in a few days to weeks. However, some hematomas may be serious and need medical care. What are the causes? This condition is caused by:  An injury.  Blood that leaks under the skin.  Problems from surgeries.  Medical conditions that cause bleeding or bruising. What increases the risk? You are more likely to develop this condition if:  You are an older adult.  You use medicines that thin your blood. What are the signs or symptoms? Symptoms depend on where the hematoma is in your body.  If the hematoma is under the skin, there is: ? A firm lump on the body. ? Pain and tenderness in the area. ? Bruising. The skin above the lump may be blue, dark blue, purple-red, or yellowish.  If the hematoma is deep in the tissues or body spaces, there may be: ? Blood in the stomach. This may cause pain in the belly (abdomen), weakness, passing out (fainting), and shortness of breath. ? Blood in the head. This may cause a headache, weakness, trouble speaking or understanding speech, or passing out. How is this diagnosed? This condition is diagnosed based on:  Your medical history.  A physical exam.  Imaging tests, such as ultrasound or CT scan.  Blood tests. How is this treated? Treatment depends on the cause, size, and location of the hematoma.  Treatment may include:  Doing nothing. Many hematomas go away on their own without treatment.  Surgery or close monitoring. This may be needed for large hematomas or hematomas that affect the body's organs.  Medicines. These may be given if a medical condition caused the hematoma. Follow these instructions at home: Managing pain, stiffness, and swelling   If told, put ice on the area. ? Put ice in a plastic bag. ? Place a towel between your skin and the bag. ? Leave the ice on for 20 minutes, 2-3 times a day for the first two days.  If told, put heat on the affected area after putting ice on the area for two days. Use the heat source that your doctor tells you to use. This could be a moist heat pack or a heating pad. To do this: ? Place a towel between your skin and the heat source. ? Leave the heat on for 20-30 minutes. ? Remove the heat if your skin turns bright red. This is very important if you are unable to feel pain, heat, or cold. You may have a greater risk of getting burned.  Raise (elevate) the affected area above the level of your heart while you are sitting or lying down.  Wrap the affected area with an elastic bandage, if told by your doctor. Do not wrap the bandage too tightly.  If your hematoma is on a leg or foot and is painful, your doctor may give you crutches. Use them as told by your doctor. General instructions  Take over-the-counter and prescription medicines only as told by your doctor.  Keep all follow-up visits as told by your doctor. This is important. Contact a doctor if:  You have a fever.  The swelling or bruising gets worse.  You start to get more hematomas. Get help right away if:  Your pain gets worse.  Your pain is not getting better with medicine.  Your skin over the hematoma breaks or starts to bleed.  Your hematoma is in your chest or belly and you: ?  Pass out. ? Feel weak. ? Become short of breath.  You have a hematoma on your  scalp that is caused by a fall or injury, and you: ? Have a headache that gets worse. ? Have trouble speaking or understanding speech. ? Become less alert or you pass out. Summary  A hematoma is a collection of blood in any part of your body.  Most hematomas get better on their own in a few days to weeks. Some may need medical care.  Follow instructions from your doctor about how to care for your hematoma.  Contact a doctor if the swelling or bruising gets worse, or if you are short of breath. This information is not intended to replace advice given to you by your health care provider. Make sure you discuss any questions you have with your health care provider. Document Revised: 10/25/2017 Document Reviewed: 10/25/2017 Elsevier Patient Education  2020 [employer].
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
## 2019-12-15 ENCOUNTER — Inpatient Hospital Stay (HOSPITAL_COMMUNITY): Payer: Self-pay

## 2019-12-15 LAB — GLUCOSE, CAPILLARY
Glucose-Capillary: 110 mg/dL — ABNORMAL HIGH (ref 70–99)
Glucose-Capillary: 132 mg/dL — ABNORMAL HIGH (ref 70–99)
Glucose-Capillary: 140 mg/dL — ABNORMAL HIGH (ref 70–99)

## 2019-12-15 MED ORDER — ACETAMINOPHEN 500 MG PO TABS: 1000.0000 mg | ORAL_TABLET | Freq: Three times a day (TID) | ORAL | 0 refills | Status: DC | PRN

## 2019-12-15 MED ORDER — ENOXAPARIN SODIUM 30 MG/0.3ML ~~LOC~~ SOLN
30.0000 mg | Freq: Two times a day (BID) | SUBCUTANEOUS | Status: DC
  Administered 2019-12-15: 30 mg via SUBCUTANEOUS
  Filled 2019-12-15: qty 0.3

## 2019-12-15 MED ORDER — METHOCARBAMOL 500 MG PO TABS: 1000.0000 mg | ORAL_TABLET | Freq: Four times a day (QID) | ORAL | 0 refills | Status: DC | PRN

## 2019-12-15 MED ORDER — OXYCODONE HCL 5 MG PO TABS: 5.0000 mg | ORAL_TABLET | Freq: Four times a day (QID) | ORAL | 0 refills | Status: DC | PRN

## 2019-12-15 MED ORDER — ADULT MULTIVITAMIN W/MINERALS CH: 1.0000 | ORAL_TABLET | Freq: Every day | ORAL | Status: DC

## 2019-12-15 MED ORDER — BACITRACIN ZINC 500 UNIT/GM EX OINT
TOPICAL_OINTMENT | CUTANEOUS | 0 refills | Status: AC
Start: 2019-12-15 — End: 2020-12-14

## 2019-12-15 MED FILL — METHOCARBAMOL 500 MG TABS: 500 | 10 days supply | Qty: 60 | Fill #0

## 2019-12-15 MED FILL — SM ANTIBIOTIC 500 UNIT/GM O: 500 | 7 days supply | Qty: 28 | Fill #0

## 2019-12-15 MED FILL — oxyCODONE HCL 5 MG TABS: 5 | 4 days supply | Qty: 15 | Fill #0
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
## 2019-12-15 NOTE — TOC CAGE-AID Note (Signed)
Transition of Care [facility]) - CAGE-AID Screening   Patient Details  Name: John Howell MRN: [id number] Date of Birth: March 10, 1966  Transition of Care [facility]) CM/SW Contact:    Emeterio Reeve, [place] Phone Number: 12/15/2019, 12:35 PM   Clinical Narrative:  CSW met with pt at bedside. CSW introduced self and explained her role at the hospital. Pt states he drinks a 6 pack of beer daily. Pt states if he has extra money will till sometimes drink more. Pt reports that he has started smoking crack cocaine about 2 months ago. Pt stated he started after a hard breakup. Pt stated he was offered it from a friend and he has used it since. Pt repots that he does need to stop.   CSW talked about pts health and resources in the community. Pt began to talk about the breakup. CSW discussed mental health support and how someone can help him though those feelings and help him develop positive coping skills. Pt stated he does not feel like he needs substance abuse resources at this time but does recognize that mental health support would be beneficial. Pt was receptive to resources and Scientist, clinical (histocompatibility and immunogenetics).   CAGE-AID Screening:    Have You Ever Felt You Ought to Cut Down on Your Drinking or Drug Use?: Yes Have People Annoyed You By Critizing Your Drinking Or Drug Use?: Yes Have You Felt Bad Or Guilty About Your Drinking Or Drug Use?: Yes Have You Ever Had a Drink or Used Drugs First Thing In The Morning to STeady Your Nerves or to Get Rid of a Hangover?: No CAGE-AID Score: 3  Substance Abuse Education Offered: Yes  Substance abuse interventions: Patient Counseling, Educational Materials   Emeterio Reeve, Latanya Presser, [place] Social Worker [phone]
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
## 2019-12-16 ENCOUNTER — Encounter (HOSPITAL_COMMUNITY): Payer: Self-pay

## 2020-01-13 ENCOUNTER — Ambulatory Visit: Payer: Self-pay | Attending: Family Medicine | Admitting: Family Medicine

## 2020-01-13 ENCOUNTER — Other Ambulatory Visit: Payer: Self-pay

## 2020-01-13 ENCOUNTER — Encounter: Payer: Self-pay | Admitting: Family Medicine

## 2020-01-13 DIAGNOSIS — E119 Type 2 diabetes mellitus without complications: Secondary | ICD-10-CM

## 2020-01-13 DIAGNOSIS — S21111A Laceration without foreign body of right front wall of thorax without penetration into thoracic cavity, initial encounter: Secondary | ICD-10-CM

## 2020-01-13 MED ORDER — METHOCARBAMOL 500 MG PO TABS: 1000.0000 mg | ORAL_TABLET | Freq: Two times a day (BID) | ORAL | 1 refills | Status: DC | PRN

## 2020-01-13 MED ORDER — MELOXICAM 7.5 MG PO TABS: 7.5000 mg | ORAL_TABLET | Freq: Every day | ORAL | 1 refills | Status: DC

## 2020-01-13 MED ORDER — METFORMIN HCL 500 MG PO TABS: 500.0000 mg | ORAL_TABLET | Freq: Two times a day (BID) | ORAL | 1 refills | Status: AC
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
## 2020-03-08 ENCOUNTER — Ambulatory Visit: Payer: Self-pay | Admitting: Family Medicine

## 2020-05-20 ENCOUNTER — Ambulatory Visit (HOSPITAL_COMMUNITY)
Admission: EM | Admit: 2020-05-20 | Discharge: 2020-05-20 | Disposition: A | Discharge status: Home / Self Care | Payer: Self-pay | Attending: Emergency Medicine | Admitting: Emergency Medicine

## 2020-05-20 ENCOUNTER — Other Ambulatory Visit: Payer: Self-pay

## 2020-05-20 ENCOUNTER — Encounter (HOSPITAL_COMMUNITY): Payer: Self-pay | Admitting: Emergency Medicine

## 2020-05-20 DIAGNOSIS — S39012A Strain of muscle, fascia and tendon of lower back, initial encounter: Secondary | ICD-10-CM | POA: Diagnosis present

## 2020-05-20 DIAGNOSIS — L509 Urticaria, unspecified: Secondary | ICD-10-CM | POA: Diagnosis present

## 2020-05-20 DIAGNOSIS — T7840XA Allergy, unspecified, initial encounter: Secondary | ICD-10-CM | POA: Diagnosis present

## 2020-05-20 MED ORDER — PREDNISONE 10 MG (21) PO TBPK: ORAL_TABLET | Freq: Every day | ORAL | 0 refills | Status: DC

## 2020-05-20 MED ORDER — CYCLOBENZAPRINE HCL 10 MG PO TABS: 10.0000 mg | ORAL_TABLET | Freq: Three times a day (TID) | ORAL | 0 refills | Status: DC | PRN

## 2020-05-20 MED ORDER — METHYLPREDNISOLONE SODIUM SUCC 125 MG IJ SOLR
80.0000 mg | Freq: Once | INTRAMUSCULAR | Status: AC
  Administered 2020-05-20: 80 mg via INTRAMUSCULAR

## 2020-05-20 MED ORDER — METHYLPREDNISOLONE SODIUM SUCC 125 MG IJ SOLR
INTRAMUSCULAR | Status: AC
  Filled 2020-05-20: qty 2

## 2020-05-20 NOTE — Discharge Instructions (Addendum)
Rest,ice, take meds as directed. Start prednisone tomorrow
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
## 2020-05-21 ENCOUNTER — Telehealth (HOSPITAL_COMMUNITY): Payer: Self-pay

## 2020-05-21 ENCOUNTER — Telehealth (HOSPITAL_COMMUNITY): Payer: Self-pay | Admitting: Family Medicine

## 2020-05-21 ENCOUNTER — Telehealth: Payer: Self-pay | Admitting: Emergency Medicine

## 2020-05-21 MED ORDER — PREDNISONE 10 MG (21) PO TBPK: ORAL_TABLET | ORAL | 0 refills | Status: DC

## 2020-05-21 MED ORDER — CYCLOBENZAPRINE HCL 10 MG PO TABS: 10.0000 mg | ORAL_TABLET | Freq: Three times a day (TID) | ORAL | 0 refills | Status: AC | PRN

## 2020-05-21 NOTE — Telephone Encounter (Signed)
Pharmacy contacted. Prescription will be sent to [facility]. Prescription will be changed as the pharmacy suggested.

## 2020-05-21 NOTE — Telephone Encounter (Signed)
Scripts from 12/16 modified to match quantity with instructions

## 2020-05-21 NOTE — Telephone Encounter (Signed)
Informed that pt has called multiple times about RXs not being at pharmacy. Contacted pt and advised him that he should have a print prescription to take to the pharmacy. Pt advised that he did have the written prescriptions in hand and would take to pharmacy.
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
## 2021-01-17 IMAGING — DX DG CHEST 1V PORT
1 series · 1 of 1 positions shown · non-contrast
Comparison: December 14, 2019 study obtained earlier in the day

CLINICAL DATA: Chest pain

EXAM:
PORTABLE CHEST 1 VIEW

[chest]
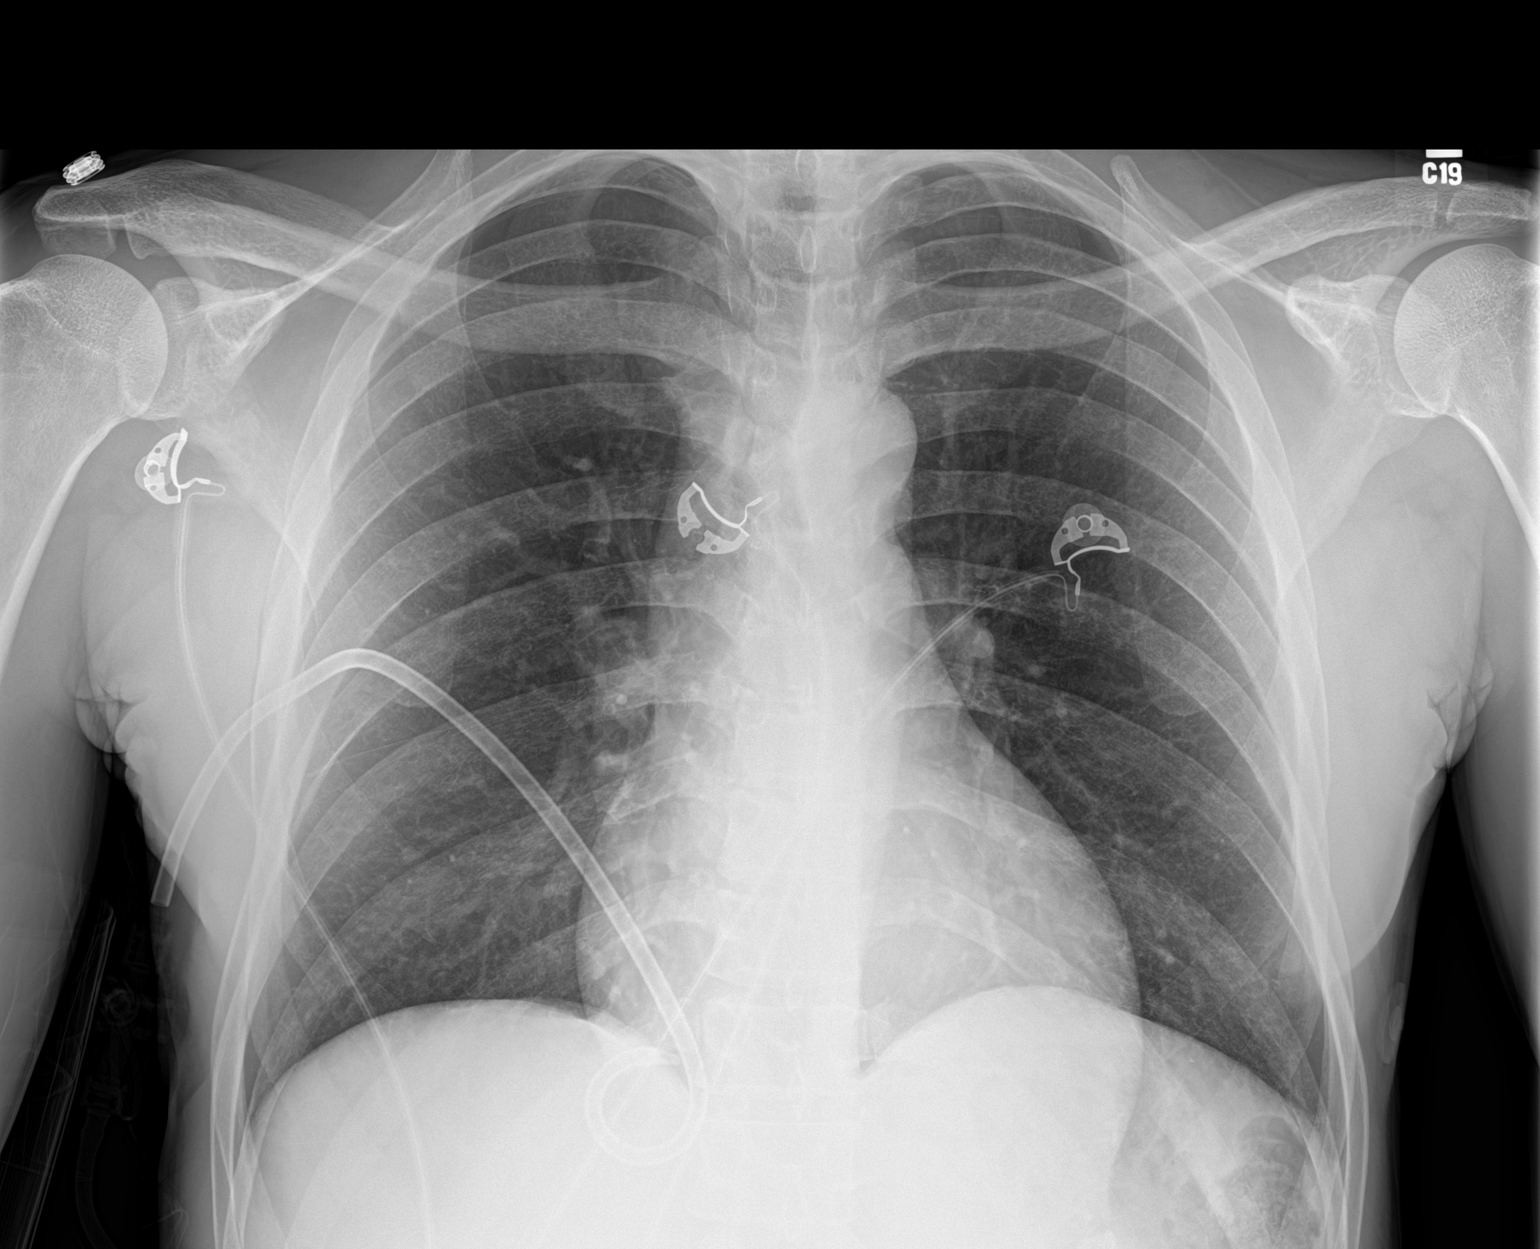

[1 of 1 positions shown; findings below may reference images not displayed]

FINDINGS: Chest tube on the right unchanged in position. No pneumothorax. The
lungs are clear. Heart size and pulmonary vascularity are normal. No
adenopathy. No bone lesions.
IMPRESSION: Chest tube on the right unchanged in position without pneumothorax.
Lungs clear. Cardiac silhouette normal.

## 2021-01-18 IMAGING — DX DG CHEST 1V PORT
1 series · 1 of 1 positions shown · non-contrast
Comparison: Prior chest radiograph 12/14/2019 and earlier

CLINICAL DATA: Right chest tube removal.

EXAM:
PORTABLE CHEST 1 VIEW

[chest ap]
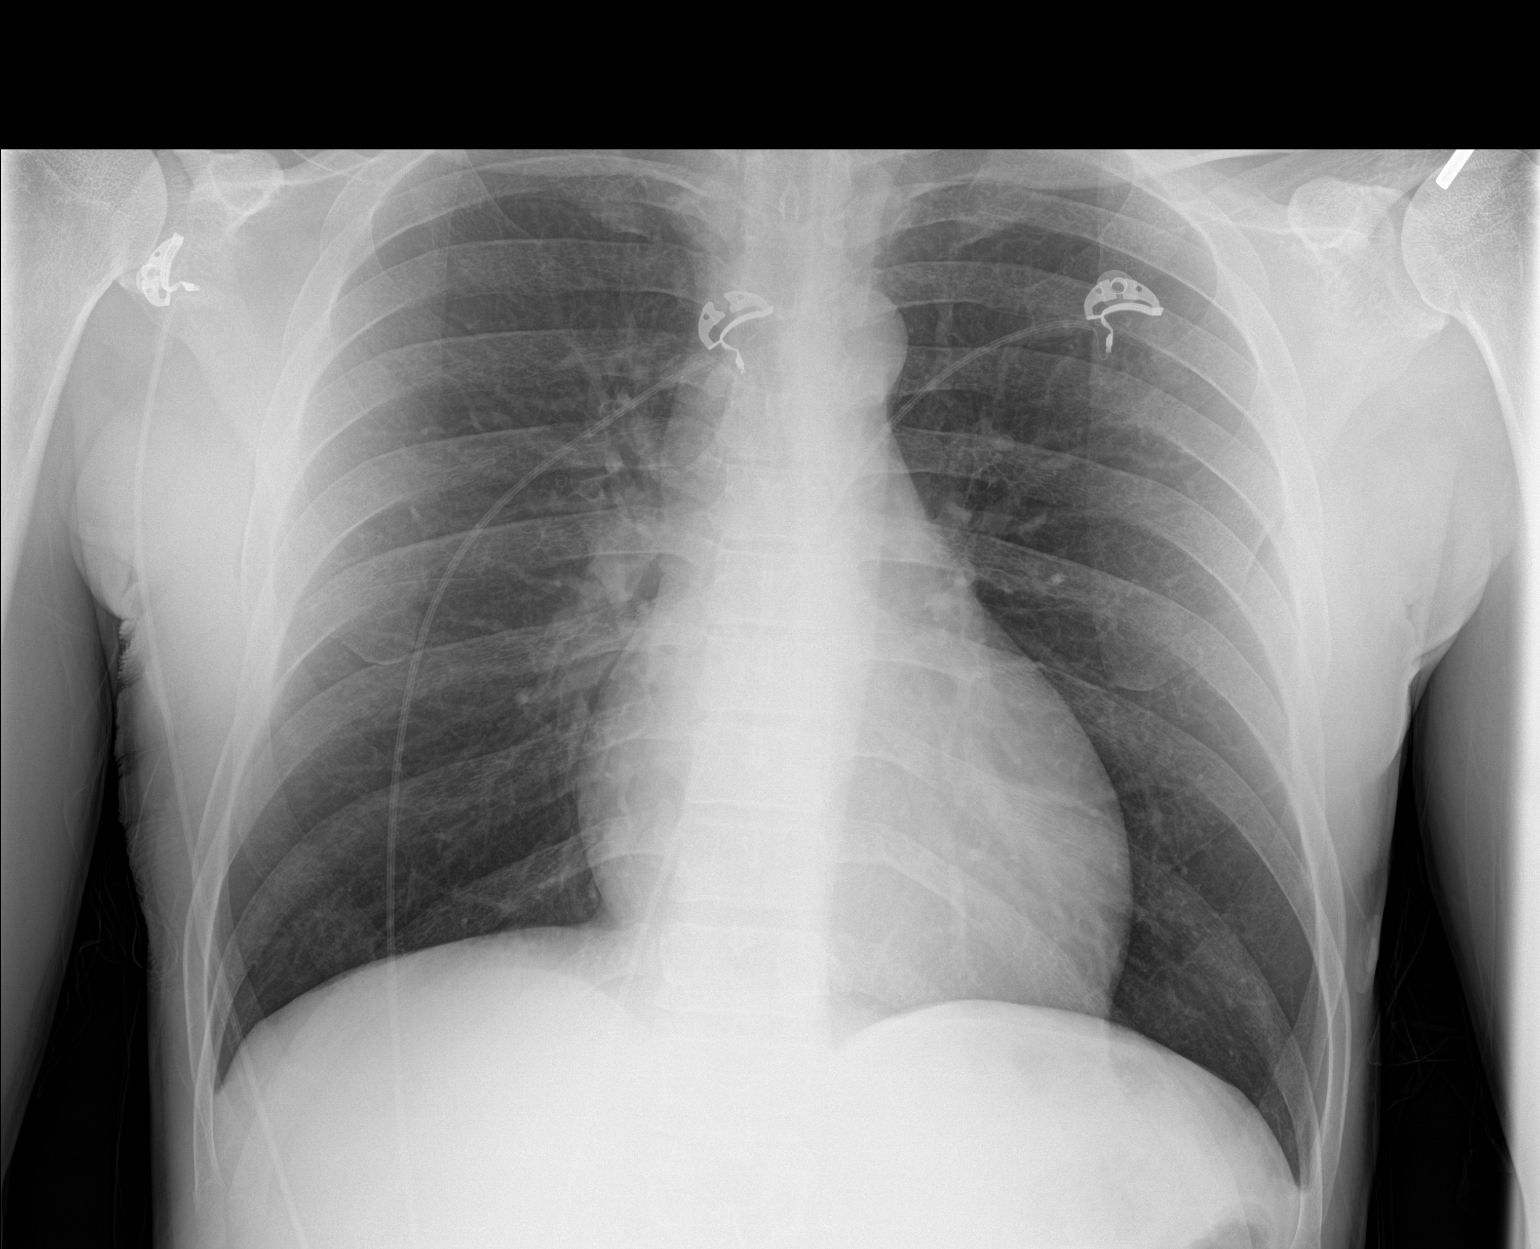

[1 of 1 positions shown; findings below may reference images not displayed]

FINDINGS: Interval removal of a previously demonstrated right-sided chest
tube. There is no appreciable pneumothorax. No airspace
consolidation or pleural effusion. Heart size within normal limits.
No acute bony abnormality identified.
IMPRESSION: Interval removal of a right-sided chest tube. No evidence of
pneumothorax.

## 2021-06-08 ENCOUNTER — Ambulatory Visit (HOSPITAL_COMMUNITY)
Admission: EM | Admit: 2021-06-08 | Discharge: 2021-06-08 | Disposition: A | Discharge status: Home / Self Care | Payer: 59 | Attending: Emergency Medicine | Admitting: Emergency Medicine

## 2021-06-08 ENCOUNTER — Other Ambulatory Visit: Payer: Self-pay

## 2021-06-08 ENCOUNTER — Encounter (HOSPITAL_COMMUNITY): Payer: Self-pay | Admitting: Emergency Medicine

## 2021-06-08 DIAGNOSIS — M5416 Radiculopathy, lumbar region: Secondary | ICD-10-CM

## 2021-06-08 MED ORDER — OXYCODONE HCL 5 MG PO TABS: 5.0000 mg | ORAL_TABLET | Freq: Every evening | ORAL | 0 refills | Status: DC | PRN

## 2021-06-08 MED ORDER — NAPROXEN 500 MG PO TABS: 500.0000 mg | ORAL_TABLET | Freq: Two times a day (BID) | ORAL | 0 refills | Status: DC

## 2021-06-08 MED ORDER — CYCLOBENZAPRINE HCL 10 MG PO TABS: 10.0000 mg | ORAL_TABLET | Freq: Every day | ORAL | 0 refills | Status: DC

## 2021-06-08 MED ORDER — KETOROLAC TROMETHAMINE 30 MG/ML IJ SOLN
INTRAMUSCULAR | Status: AC
  Filled 2021-06-08: qty 1

## 2021-06-08 MED ORDER — KETOROLAC TROMETHAMINE 30 MG/ML IJ SOLN
30.0000 mg | Freq: Once | INTRAMUSCULAR | Status: AC
  Administered 2021-06-08: 30 mg via INTRAMUSCULAR
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
## 2021-06-08 NOTE — ED Triage Notes (Signed)
Patient is having back pain.  Patient has chronic back pain.  Lower back pain, occasionally radiating into buttocks bilaterally.

## 2021-06-08 NOTE — Discharge Instructions (Signed)
Your pain is most likely caused by irritation to the muscles or ligaments.   Take naproxen twice a day for the next 5 days then as needed  May use muscle relaxer at bedtime for additional comfort  For severe pain you may use oxycodone at bedtime, please use sparingly as you have only been dispensed 6 tablets  You may use heating pad in 15 minute intervals as needed for additional comfort,  Begin stretching affected area daily for 10 minutes as tolerated to further loosen muscles   When lying down place pillow underneath and between knees for support  Can try sleeping without pillow on firm mattress   Practice good posture: head back, shoulders back, chest forward, pelvis back and weight distributed evenly on both legs  If pain persist after recommended treatment or reoccurs if may be beneficial to follow up with orthopedic specialist for evaluation, this doctor specializes in the bones and can manage your symptoms long-term with options such as but not limited to imaging, medications or physical therapy

## 2021-09-05 ENCOUNTER — Encounter (HOSPITAL_COMMUNITY): Payer: Self-pay

## 2021-09-05 ENCOUNTER — Ambulatory Visit (HOSPITAL_COMMUNITY)
Admission: EM | Admit: 2021-09-05 | Discharge: 2021-09-05 | Disposition: A | Discharge status: Home / Self Care | Payer: PRIVATE HEALTH INSURANCE | Attending: Nurse Practitioner | Admitting: Nurse Practitioner

## 2021-09-05 DIAGNOSIS — K047 Periapical abscess without sinus: Secondary | ICD-10-CM

## 2021-09-05 DIAGNOSIS — K0889 Other specified disorders of teeth and supporting structures: Secondary | ICD-10-CM

## 2021-09-05 MED ORDER — AMOXICILLIN-POT CLAVULANATE 875-125 MG PO TABS: 1.0000 | ORAL_TABLET | Freq: Two times a day (BID) | ORAL | 0 refills | Status: AC

## 2021-09-05 MED ORDER — KETOROLAC TROMETHAMINE 30 MG/ML IJ SOLN
30.0000 mg | Freq: Once | INTRAMUSCULAR | Status: AC
  Administered 2021-09-05: 30 mg via INTRAMUSCULAR

## 2021-09-05 MED ORDER — KETOROLAC TROMETHAMINE 30 MG/ML IJ SOLN
INTRAMUSCULAR | Status: AC
  Filled 2021-09-05: qty 1

## 2021-09-05 MED ORDER — OXYCODONE-ACETAMINOPHEN 5-325 MG PO TABS: 1.0000 | ORAL_TABLET | Freq: Three times a day (TID) | ORAL | 0 refills | Status: AC | PRN
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
## 2021-09-05 NOTE — ED Triage Notes (Signed)
Pt presents for dental pain and pressure. He is requesting something for pain.  ?

## 2021-09-05 NOTE — Discharge Instructions (Addendum)
-   We have given you an injection of Toradol today to help with your pain ?- Please start the Augmentin to help with the infection around your tooth ?- You can use the Percocet every 8 hours as needed for dental pain as well as naproxen and ice ?- I have added you to our list to help connect you with a primary care provider ?- Please contact one of the dentists on the handout provided to establish care with a dental clinic ?

## 2022-04-14 ENCOUNTER — Encounter (HOSPITAL_COMMUNITY): Payer: Self-pay

## 2022-04-14 ENCOUNTER — Ambulatory Visit (HOSPITAL_COMMUNITY)
Admission: EM | Admit: 2022-04-14 | Discharge: 2022-04-14 | Disposition: A | Discharge status: Home / Self Care | Payer: Commercial Managed Care - HMO | Attending: Internal Medicine | Admitting: Internal Medicine

## 2022-04-14 DIAGNOSIS — S025XXA Fracture of tooth (traumatic), initial encounter for closed fracture: Secondary | ICD-10-CM | POA: Diagnosis not present

## 2022-04-14 DIAGNOSIS — K0889 Other specified disorders of teeth and supporting structures: Secondary | ICD-10-CM | POA: Diagnosis not present

## 2022-04-14 MED ORDER — IBUPROFEN 600 MG PO TABS: 600.0000 mg | ORAL_TABLET | Freq: Four times a day (QID) | ORAL | 0 refills | Status: DC | PRN

## 2022-04-14 MED ORDER — IBUPROFEN 800 MG PO TABS
ORAL_TABLET | ORAL | Status: AC
  Filled 2022-04-14: qty 1

## 2022-04-14 MED ORDER — IBUPROFEN 800 MG PO TABS
800.0000 mg | ORAL_TABLET | Freq: Once | ORAL | Status: AC
Start: 2022-04-14 — End: 2022-04-14
  Administered 2022-04-14: 800 mg via ORAL

## 2022-04-14 MED ORDER — AMOXICILLIN 500 MG PO CAPS: 500.0000 mg | ORAL_CAPSULE | Freq: Three times a day (TID) | ORAL | 0 refills | Status: DC
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
## 2022-04-14 NOTE — Discharge Instructions (Addendum)
Take amoxicillin antibiotic 3 times daily for the next 7 days to treat your dental infection. Apply ice to the outside of your face to reduce inflammation and pain. Continue use of ibuprofen 600 mg every 6 hours as needed with food for dental inflammation and pain.  You may also take 1000 mg of Tylenol every 6 hours as needed with this for breakthrough pain.  Schedule an appointment with one of the dentist on the list provided to urgent care today.  If you develop any new or worsening symptoms or do not improve in the next 2 to 3 days, please return.  If your symptoms are severe, please go to the emergency room.  Follow-up with your primary care provider for further evaluation and management of your symptoms as well as ongoing wellness visits.  I hope you feel better!

## 2022-04-14 NOTE — ED Triage Notes (Signed)
Tooth pain on the left side. States there is a hole in the wisdom tooth. Tylenol not helping.  Patient states he thinks it is infected. Onset 3 weeks.   Patient is set to have this pulled.

## 2022-04-19 ENCOUNTER — Telehealth (HOSPITAL_COMMUNITY): Payer: Self-pay | Admitting: Emergency Medicine

## 2022-04-19 MED ORDER — IBUPROFEN 600 MG PO TABS
600.0000 mg | ORAL_TABLET | Freq: Four times a day (QID) | ORAL | 0 refills | Status: DC | PRN
Start: 2022-04-19 — End: 2022-07-19

## 2022-04-19 MED ORDER — AMOXICILLIN 500 MG PO CAPS: 500.0000 mg | ORAL_CAPSULE | Freq: Three times a day (TID) | ORAL | 0 refills | Status: AC

## 2022-04-19 NOTE — Telephone Encounter (Signed)
Patient requested a different pharmacy

## 2022-07-19 ENCOUNTER — Encounter (HOSPITAL_COMMUNITY): Payer: Self-pay

## 2022-07-19 ENCOUNTER — Ambulatory Visit (HOSPITAL_COMMUNITY)
Admission: EM | Admit: 2022-07-19 | Discharge: 2022-07-19 | Disposition: A | Discharge status: Home / Self Care | Payer: Commercial Managed Care - HMO | Attending: Sports Medicine | Admitting: Sports Medicine

## 2022-07-19 DIAGNOSIS — M5416 Radiculopathy, lumbar region: Secondary | ICD-10-CM

## 2022-07-19 MED ORDER — IBUPROFEN 800 MG PO TABS: 800.0000 mg | ORAL_TABLET | Freq: Three times a day (TID) | ORAL | 0 refills | Status: DC

## 2022-07-19 MED ORDER — METHYLPREDNISOLONE 4 MG PO TBPK: ORAL_TABLET | ORAL | 0 refills | Status: DC

## 2022-07-19 NOTE — ED Provider Notes (Signed)
[facility]   [id number] 07/19/22 Arrival Time: 1030  ASSESSMENT & PLAN:  1. Lumbar back pain with radiculopathy affecting left lower extremity    -History and exam is consistent with low back pain with radiculopathy.  No red flag signs or symptoms.  Will treat this conservatively with a Medrol Dosepak and ibuprofen.  Counseled to watch out for elevation of sugars.  If no improvement, recommend he follow-up with my partners at the [facility] sports medicine clinic to get involved physical therapy and potentially order an MRI if it does not respond to conservative treatment.  Also gave him the name of Dr. Bartholomew Crews to establish care at [facility] for the medicine clinic.  All questions answered and agrees to plan.  This is a chronic condition with exacerbation and I performed prescription management.  Meds ordered this encounter  Medications   ibuprofen (ADVIL) 800 MG tablet    Sig: Take 1 tablet (800 mg total) by mouth 3 (three) times daily.    Dispense:  21 tablet    Refill:  0   methylPREDNISolone (MEDROL DOSEPAK) 4 MG TBPK tablet    Sig: Take as directed on packaging    Dispense:  21 tablet    Refill:  0   Discharge Instructions   None     Follow-up Information     Hudnall, Sharyn Lull, MD.   Specialties: Sports Medicine, Family Medicine Why: If symptoms worsen. We can get you in physical therapy and potentially eventually order MRI if you need it Contact information: [address] [place] [address] [phone]         Orvis Brill, DO.   Specialty: Family Medicine Why: To establish care as new patient Contact information: [place] [address] [phone]                  Reviewed expectations re: course of current medical issues. Questions answered. Outlined signs and symptoms indicating need for more acute intervention. Patient verbalized understanding. After Visit Summary given.   SUBJECTIVE: Very pleasant  57 year old male comes urgent care to be evaluated for low back pain.  He reports he has had low back pain intermittently for over a year now.  He thinks he might have a slipped disc.  His back pain started acutely worsening over the last few days.  He was unable to go to work on Monday due to the pain.  His pain is worse at night and at the end of the day and also first thing in the morning when he wakes up.  Describes a sharp shooting shooting pain that is stabbing in nature on the left lower back.  It radiates down the left leg.  He tries to take extra Tylenol to manage this but the pain seems to return.  He denies any fevers, numbness or tingling in the groin, or bowel or bladder control issues.  Of note, he does have history of diabetes.  He takes metformin for this.  He uses his family members glucose meter and says his glucose is usually around the low 100s.  But he does need a new primary care doctor.  No LMP for male patient. History reviewed. No pertinent surgical history.   OBJECTIVE:  Vitals:   07/19/22 1110  BP: 132/87  Pulse: 82  Resp: 12  Temp: 98.3 F (36.8 C)  TempSrc: Oral  SpO2: 98%     Physical Exam Vitals and nursing note reviewed.  Constitutional:  General: He is not in acute distress.    Appearance: Normal appearance.  Cardiovascular:     Rate and Rhythm: Normal rate.  Pulmonary:     Effort: Pulmonary effort is normal.  Musculoskeletal:     Comments: Lumbar spine -no obvious deformity.  Tender to palpation lumbar paraspinal musculature on the left side.  Nontender on the right side.  No tenderness over spinous processes, no bony step-off.  Range of motion flexion extension is limited by pain.  LLE: Full range of motion of the hip.  4/5 strength with resisted hip flexion.  Negative logroll test.  Positive straight leg raise.  Positive FADIR test.  Negative FABER test.  2+ patellar reflex  RLE: Full range of motion of the hip.  4/5 strength with resisted  hip flexion.  Negative logroll test.  Positive straight leg raise.  Positive FADIR test.  Negative FABER test.  2+ patellar reflex  Neurological:     Mental Status: He is alert.      Labs: Results for orders placed or performed in visit on 11/27/18  Comprehensive metabolic panel  Result Value Ref Range   Sodium 139 135 - 145 mEq/L   Potassium 4.5 3.5 - 5.1 mEq/L   Chloride 105 96 - 112 mEq/L   CO2 30 19 - 32 mEq/L   Glucose, Bld 109 (H) 70 - 99 mg/dL   BUN 14 6 - 23 mg/dL   Creatinine, Ser 0.89 0.40 - 1.50 mg/dL   Total Bilirubin 0.4 0.2 - 1.2 mg/dL   Alkaline Phosphatase 48 39 - 117 U/L   AST 16 0 - 37 U/L   ALT 17 0 - 53 U/L   Total Protein 6.6 6.0 - 8.3 g/dL   Albumin 4.1 3.5 - 5.2 g/dL   Calcium 9.5 8.4 - 10.5 mg/dL   GFR 108.35 >60.00 mL/min  CBC (no diff)  Result Value Ref Range   WBC 7.2 4.0 - 10.5 K/uL   RBC 4.31 4.22 - 5.81 Mil/uL   Platelets 251.0 150.0 - 400.0 K/uL   Hemoglobin 13.5 13.0 - 17.0 g/dL   HCT 39.9 39.0 - 52.0 %   MCV 92.6 78.0 - 100.0 fl   MCHC 33.8 30.0 - 36.0 g/dL   RDW 12.6 11.5 - 15.5 %  Lipid panel  Result Value Ref Range   Cholesterol 189 0 - 200 mg/dL   Triglycerides 281.0 (H) 0.0 - 149.0 mg/dL   HDL 56.10 >39.00 mg/dL   VLDL 56.2 (H) 0.0 - 40.0 mg/dL   Total CHOL/HDL Ratio 3    NonHDL 132.64   LDL cholesterol, direct  Result Value Ref Range   Direct LDL 93.0 mg/dL  POCT glycosylated hemoglobin (Hb A1C)  Result Value Ref Range   Hemoglobin A1C 8.2 (A) 4.0 - 5.6 %   HbA1c POC (<> result, manual entry)     HbA1c, POC (prediabetic range)     HbA1c, POC (controlled diabetic range)     Labs Reviewed - No data to display  Imaging: No results found.   No Known Allergies                                             Past Medical History:  Diagnosis Date   Diabetes mellitus without complication ([place])    Hypertension    Peptic ulcer     Social History   Socioeconomic  History   Marital status: Single    Spouse name: Not on file    Number of children: Not on file   Years of education: Not on file   Highest education level: Not on file  Occupational History   Not on file  Tobacco Use   Smoking status: Every Day    Types: Cigarettes   Smokeless tobacco: Never   Tobacco comments:    2  a day  Vaping Use   Vaping Use: Never used  Substance and Sexual Activity   Alcohol use: Yes    Comment: 6 pack a day   Drug use: Yes    Comment: crack    Sexual activity: Not on file  Other Topics Concern   Not on file  Social History Narrative   ** Merged History Encounter **       Social Determinants of Health   Financial Resource Strain: Not on file  Food Insecurity: Not on file  Transportation Needs: Not on file  Physical Activity: Not on file  Stress: Not on file  Social Connections: Not on file  Intimate Partner Violence: Not on file    Family History  Problem Relation Age of Onset   Diabetes Father    Cancer Mother       Dortha Kern, MD 07/19/22 1144

## 2022-08-03 ENCOUNTER — Encounter (HOSPITAL_COMMUNITY): Payer: Self-pay

## 2022-08-03 ENCOUNTER — Ambulatory Visit (HOSPITAL_COMMUNITY)
Admission: EM | Admit: 2022-08-03 | Discharge: 2022-08-03 | Disposition: A | Discharge status: Home / Self Care | Payer: Self-pay | Attending: Family Medicine | Admitting: Family Medicine

## 2022-08-03 DIAGNOSIS — M5442 Lumbago with sciatica, left side: Secondary | ICD-10-CM

## 2022-08-03 DIAGNOSIS — G8929 Other chronic pain: Secondary | ICD-10-CM

## 2022-08-03 MED ORDER — DICLOFENAC SODIUM 75 MG PO TBEC: 75.0000 mg | DELAYED_RELEASE_TABLET | Freq: Two times a day (BID) | ORAL | 0 refills | Status: AC

## 2022-08-03 MED ORDER — CYCLOBENZAPRINE HCL 10 MG PO TABS
ORAL_TABLET | ORAL | 0 refills | Status: AC
Start: 2022-08-03 — End: ?

## 2022-08-03 MED ORDER — PREDNISONE 20 MG PO TABS: 40.0000 mg | ORAL_TABLET | Freq: Every day | ORAL | 0 refills | Status: AC

## 2022-08-03 NOTE — ED Triage Notes (Signed)
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 Pt was prescribed Tylenol or Motrin but this does not help.

## 2022-08-03 NOTE — Discharge Instructions (Signed)

## 2022-08-08 NOTE — ED Provider Notes (Signed)
[place]   [id number] 08/03/22 Arrival Time: [id number]  ASSESSMENT & PLAN:  1. Chronic bilateral low back pain with left-sided sciatica    Acute exacerbation.  Able to ambulate here and hemodynamically stable. No indication for imaging of back at this time given no trauma and normal neurological exam. Discussed.  Meds ordered this encounter  Medications   predniSONE (DELTASONE) 20 MG tablet    Sig: Take 2 tablets (40 mg total) by mouth daily.    Dispense:  10 tablet    Refill:  0   cyclobenzaprine (FLEXERIL) 10 MG tablet    Sig: Take 1 tablet by mouth before bed as needed for muscle spasm. Warning: May cause drowsiness.    Dispense:  10 tablet    Refill:  0   diclofenac (VOLTAREN) 75 MG EC tablet    Sig: Take 1 tablet (75 mg total) by mouth 2 (two) times daily.    Dispense:  14 tablet    Refill:  0   Work/school excuse note: provided. Medication sedation precautions given. Encourage ROM/movement as tolerated.  Recommend:  Follow-up Information     [facility].   Why: If worsening or failing to improve as anticipated. Contact information: [address] [place] [id number]                Reviewed expectations re: course of current medical issues. Questions answered. Outlined signs and symptoms indicating need for more acute intervention. Patient verbalized understanding. After Visit Summary given.   SUBJECTIVE: History from: patient.  John Howell is a 57 y.o. male who presents with complaint of fairly persistent bilateral lower back discomfort. Onset gradual. First noted  over past few weeks . Denies trauma. History of similar back pains for years. Pain describes as aching. Does radiate down L leg at times. Normal bowel/bladder habits. Is ambulatory here. No extremity sensation changes or weakness.  No treatment PTA.  OBJECTIVE:  Vitals:   08/03/22 1547  BP: 122/77  Pulse: 64   Resp: 16  Temp: 97.9 F (36.6 C)  TempSrc: Oral  SpO2: 98%    General appearance: alert; no distress HEENT: [place]; AT Neck: supple with FROM; without midline tenderness CV: regular Lungs: unlabored respirations; speaks full sentences without difficulty Abdomen: soft, non-tender; non-distended Back: mild  and poorly localized tenderness to palpation over lower lumbar bilateral musculature extending into buttock ; FROM at waist; bruising: none; without midline tenderness Extremities: without edema; symmetrical without gross deformities; normal ROM of bilateral LE Skin: warm and dry Neurologic: normal gait; normal sensation and strength of bilateral LE Psychological: alert and cooperative; normal mood and affect    No Known Allergies  Past Medical History:  Diagnosis Date   Diabetes mellitus without complication (HCC)    Hypertension    Peptic ulcer    Social History   Socioeconomic History   Marital status: Single    Spouse name: Not on file   Number of children: Not on file   Years of education: Not on file   Highest education level: Not on file  Occupational History   Not on file  Tobacco Use   Smoking status: Every Day    Types: Cigarettes   Smokeless tobacco: Never   Tobacco comments:    2  a day  Vaping Use   Vaping Use: Never used  Substance and Sexual Activity   Alcohol use: Yes    Comment: 6 pack a day  Drug use: Yes    Comment: crack    Sexual activity: Not on file  Other Topics Concern   Not on file  Social History Narrative   ** Merged History Encounter **       Social Determinants of Health   Financial Resource Strain: Not on file  Food Insecurity: Not on file  Transportation Needs: Not on file  Physical Activity: Not on file  Stress: Not on file  Social Connections: Not on file  Intimate Partner Violence: Not on file   Family History  Problem Relation Age of Onset   Diabetes Father    Cancer Mother    History reviewed. No pertinent  surgical history.    Vanessa Kick, MD 08/08/22 1020

## 2022-09-15 ENCOUNTER — Other Ambulatory Visit: Payer: Self-pay

## 2022-09-15 ENCOUNTER — Encounter (HOSPITAL_COMMUNITY): Payer: Self-pay

## 2022-09-15 ENCOUNTER — Emergency Department (HOSPITAL_COMMUNITY)
Admission: EM | Admit: 2022-09-15 | Discharge: 2022-09-16 | Disposition: A | Discharge status: Home / Self Care | Payer: Medicaid Other | Attending: Emergency Medicine | Admitting: Emergency Medicine

## 2022-09-15 DIAGNOSIS — Z5321 Procedure and treatment not carried out due to patient leaving prior to being seen by health care provider: Secondary | ICD-10-CM | POA: Diagnosis not present

## 2022-09-15 DIAGNOSIS — M549 Dorsalgia, unspecified: Secondary | ICD-10-CM | POA: Insufficient documentation

## 2022-09-15 NOTE — ED Triage Notes (Signed)
Pt came in via POV d/t lower back pain that happened at work on 07/20/22. A/Ox4, rates pain 10/10 & states no pain meds he has been giving are helping, UC would not see him d/t workers comp.

## 2022-09-15 NOTE — ED Provider Triage Note (Signed)
Emergency Medicine Provider Triage Evaluation Note  John Howell , a 57 y.o. male  was evaluated in triage.  Patient presenting with back pain since February.  Reports that Tylenol, ibuprofen, muscle relaxants and lidocaine patches are not helping him.  Wants help with the pain.  No red flags  Review of Systems  Positive:  Negative:  Physical Exam  Ht 6' (1.829 m)   Wt 65.8 kg   BMI 19.67 kg/m  Gen:   Awake, no distress   Resp:  Normal effort  MSK:   Moves extremities without difficulty  Other:  Midline lumbar spinal tenderness  Medical Decision Making  Medically screening exam initiated at 2:30 PM.  Appropriate orders placed.  Nerik Condray was informed that the remainder of the evaluation will be completed by another provider, this initial triage assessment does not replace that evaluation, and the importance of remaining in the ED until their evaluation is complete    Saddie Benders, PA-C 09/15/22 1431

## 2022-12-06 DIAGNOSIS — E119 Type 2 diabetes mellitus without complications: Secondary | ICD-10-CM | POA: Diagnosis not present

## 2022-12-06 DIAGNOSIS — M5442 Lumbago with sciatica, left side: Secondary | ICD-10-CM | POA: Diagnosis not present

## 2022-12-06 DIAGNOSIS — M5441 Lumbago with sciatica, right side: Secondary | ICD-10-CM | POA: Diagnosis not present

## 2023-01-18 LAB — HEMOGLOBIN A1C: Hemoglobin A1C: 6.7

## 2023-01-18 NOTE — Progress Notes (Signed)
SDOH resources for food and transportation given

## 2023-01-23 ENCOUNTER — Encounter: Payer: Self-pay | Admitting: *Deleted

## 2023-01-23 NOTE — Progress Notes (Signed)
Pt attended 01/18/2023 screening event where his b/p was 108/80 and his A1C was 6.7. At the event, pt confirmed his PCP was Roselyn Reef, FNP, and identified food and transportation insecurities, for which he was given resources as the event. Chart review indicates pt was seen by FNP Samuel Bouche at [facility] on [facility]  on 12/06/22 when his b/p was 123/79 and his A1C was 7.2, and where he was also evaluated for SDOH needs. No additional health equity team support indicated at this time.

## 2023-03-07 LAB — HEMOGLOBIN A1C: Hemoglobin A1C: 7.5

## 2023-03-07 LAB — AMB RESULTS CONSOLE CBG: Glucose: 145

## 2023-03-07 NOTE — Progress Notes (Signed)
No new SDOH needs since 8/15

## 2023-04-26 NOTE — Progress Notes (Signed)
The patient attended 03/07/23 screening event where his Blood Glucose screening results as 145mg /dl and his Z6X was 7.5. At the event the patient noted he has a pcp and insurance. Patient declined SDOH questionnaire. Per chart review pt does have a pcp, and the last ov with pcp was 12/06/22. Chart review did not indicate any future appts. Chart review indicated that the pcp is already of aware of pts type 2 diabetes history. Chart review also indicated the pt is currently on metformin.  No additional Health equity team support indicated at this time.

## 2023-12-18 DIAGNOSIS — F1020 Alcohol dependence, uncomplicated: Secondary | ICD-10-CM | POA: Diagnosis not present

## 2023-12-20 DIAGNOSIS — F1020 Alcohol dependence, uncomplicated: Secondary | ICD-10-CM | POA: Diagnosis not present

## 2023-12-21 DIAGNOSIS — F1020 Alcohol dependence, uncomplicated: Secondary | ICD-10-CM | POA: Diagnosis not present

## 2023-12-22 DIAGNOSIS — F1020 Alcohol dependence, uncomplicated: Secondary | ICD-10-CM | POA: Diagnosis not present

## 2023-12-23 DIAGNOSIS — F1020 Alcohol dependence, uncomplicated: Secondary | ICD-10-CM | POA: Diagnosis not present

## 2023-12-24 DIAGNOSIS — E1165 Type 2 diabetes mellitus with hyperglycemia: Secondary | ICD-10-CM | POA: Diagnosis not present

## 2023-12-24 DIAGNOSIS — Z7984 Long term (current) use of oral hypoglycemic drugs: Secondary | ICD-10-CM | POA: Diagnosis not present

## 2023-12-24 DIAGNOSIS — F1020 Alcohol dependence, uncomplicated: Secondary | ICD-10-CM | POA: Diagnosis not present

## 2023-12-24 DIAGNOSIS — Z5321 Procedure and treatment not carried out due to patient leaving prior to being seen by health care provider: Secondary | ICD-10-CM | POA: Diagnosis not present
